# Patient Record
Sex: Female | Born: 1980 | Hispanic: No | Marital: Married | Smoking: Never smoker
Health system: Southern US, Community
[De-identification: ages and names within clinical notes are randomized; demographics above are authoritative.]

## PROBLEM LIST (undated history)

## (undated) ENCOUNTER — Inpatient Hospital Stay (HOSPITAL_COMMUNITY): Payer: Self-pay

## (undated) ENCOUNTER — Emergency Department (HOSPITAL_COMMUNITY): Admission: EM | Payer: BLUE CROSS/BLUE SHIELD

## (undated) DIAGNOSIS — N83209 Unspecified ovarian cyst, unspecified side: Secondary | ICD-10-CM

## (undated) DIAGNOSIS — O24419 Gestational diabetes mellitus in pregnancy, unspecified control: Secondary | ICD-10-CM

## (undated) DIAGNOSIS — Z789 Other specified health status: Secondary | ICD-10-CM

## (undated) HISTORY — PX: CHOLECYSTECTOMY: SHX55

---

## 2009-07-30 ENCOUNTER — Ambulatory Visit: Payer: Self-pay | Admitting: Gynecology

## 2009-07-30 ENCOUNTER — Inpatient Hospital Stay (HOSPITAL_COMMUNITY): Admission: AD | Admit: 2009-07-30 | Discharge: 2009-07-30 | Payer: Self-pay | Admitting: Obstetrics and Gynecology

## 2009-08-02 ENCOUNTER — Inpatient Hospital Stay (HOSPITAL_COMMUNITY): Admission: AD | Admit: 2009-08-02 | Discharge: 2009-08-02 | Payer: Self-pay | Admitting: Obstetrics and Gynecology

## 2009-08-02 ENCOUNTER — Ambulatory Visit: Payer: Self-pay | Admitting: Obstetrics and Gynecology

## 2009-08-10 ENCOUNTER — Ambulatory Visit: Payer: Self-pay | Admitting: Advanced Practice Midwife

## 2009-08-10 ENCOUNTER — Inpatient Hospital Stay (HOSPITAL_COMMUNITY): Admission: AD | Admit: 2009-08-10 | Discharge: 2009-08-10 | Payer: Self-pay | Admitting: Obstetrics & Gynecology

## 2009-09-16 ENCOUNTER — Ambulatory Visit: Payer: Self-pay | Admitting: Obstetrics & Gynecology

## 2009-10-28 ENCOUNTER — Other Ambulatory Visit: Admission: RE | Admit: 2009-10-28 | Discharge: 2009-10-28 | Payer: Self-pay | Admitting: Obstetrics & Gynecology

## 2009-10-28 ENCOUNTER — Ambulatory Visit: Payer: Self-pay | Admitting: Obstetrics & Gynecology

## 2009-10-28 ENCOUNTER — Encounter: Payer: Self-pay | Admitting: Obstetrics & Gynecology

## 2009-10-28 LAB — CONVERTED CEMR LAB: Chlamydia, DNA Probe: NEGATIVE

## 2010-01-04 ENCOUNTER — Ambulatory Visit (HOSPITAL_COMMUNITY)
Admission: RE | Admit: 2010-01-04 | Discharge: 2010-01-04 | Payer: Self-pay | Source: Home / Self Care | Attending: Obstetrics and Gynecology | Admitting: Obstetrics and Gynecology

## 2010-04-01 LAB — POCT PREGNANCY, URINE: Preg Test, Ur: NEGATIVE

## 2010-04-03 LAB — HCG, QUANTITATIVE, PREGNANCY
hCG, Beta Chain, Quant, S: 3 m[IU]/mL (ref <5)
hCG, Beta Chain, Quant, S: 47 m[IU]/mL — ABNORMAL HIGH (ref <5)

## 2010-04-04 LAB — DIFFERENTIAL
Basophils Absolute: 0.1 10*3/uL (ref 0.0–0.1)
Basophils Relative: 1 % (ref 0–1)
Monocytes Absolute: 0.5 10*3/uL (ref 0.1–1.0)
Neutrophils Relative %: 68 % (ref 43–77)

## 2010-04-04 LAB — CBC
HCT: 36.4 % (ref 36.0–46.0)
MCH: 27.7 pg (ref 26.0–34.0)
RBC: 4.41 MIL/uL (ref 3.87–5.11)
RDW: 13.1 % (ref 11.5–15.5)
WBC: 8.7 10*3/uL (ref 4.0–10.5)

## 2010-04-04 LAB — WET PREP, GENITAL
Clue Cells Wet Prep HPF POC: NONE SEEN
Trich, Wet Prep: NONE SEEN
Yeast Wet Prep HPF POC: NONE SEEN

## 2010-04-04 LAB — ABO/RH: ABO/RH(D): B POS

## 2010-04-04 LAB — POCT PREGNANCY, URINE: Preg Test, Ur: NEGATIVE

## 2010-04-04 LAB — COMPREHENSIVE METABOLIC PANEL
AST: 14 U/L (ref 0–37)
Albumin: 4.3 g/dL (ref 3.5–5.2)
Alkaline Phosphatase: 43 U/L (ref 39–117)
BUN: 5 mg/dL — ABNORMAL LOW (ref 6–23)
CO2: 25 mEq/L (ref 19–32)
Calcium: 9.3 mg/dL (ref 8.4–10.5)
GFR calc Af Amer: >60 mL/min (ref >60)
Glucose, Bld: 92 mg/dL (ref 70–99)
Potassium: 3.4 mEq/L — ABNORMAL LOW (ref 3.5–5.1)
Sodium: 135 mEq/L (ref 135–145)
Total Bilirubin: 0.4 mg/dL (ref 0.3–1.2)

## 2010-04-04 LAB — PREGNANCY, URINE: Preg Test, Ur: POSITIVE

## 2010-04-04 LAB — GC/CHLAMYDIA PROBE AMP, GENITAL
Chlamydia, DNA Probe: NEGATIVE
GC Probe Amp, Genital: NEGATIVE

## 2010-04-04 LAB — URINE MICROSCOPIC-ADD ON

## 2010-04-04 LAB — HCG, QUANTITATIVE, PREGNANCY: hCG, Beta Chain, Quant, S: 89 m[IU]/mL — ABNORMAL HIGH (ref <5)

## 2010-04-04 LAB — URINALYSIS, ROUTINE W REFLEX MICROSCOPIC
Glucose, UA: NEGATIVE mg/dL
Nitrite: NEGATIVE

## 2011-01-30 ENCOUNTER — Ambulatory Visit (HOSPITAL_COMMUNITY)
Admission: AD | Admit: 2011-01-30 | Discharge: 2011-01-31 | Disposition: A | Discharge status: Home / Self Care | Payer: Medicaid Other | Source: Ambulatory Visit | Attending: Obstetrics and Gynecology | Admitting: Obstetrics and Gynecology

## 2011-01-30 ENCOUNTER — Encounter (HOSPITAL_COMMUNITY): Payer: Self-pay | Admitting: *Deleted

## 2011-01-30 DIAGNOSIS — O009 Unspecified ectopic pregnancy without intrauterine pregnancy: Secondary | ICD-10-CM | POA: Diagnosis present

## 2011-01-30 DIAGNOSIS — O00109 Unspecified tubal pregnancy without intrauterine pregnancy: Principal | ICD-10-CM | POA: Insufficient documentation

## 2011-01-30 HISTORY — DX: Other specified health status: Z78.9

## 2011-01-30 HISTORY — DX: Gestational diabetes mellitus in pregnancy, unspecified control: O24.419

## 2011-01-30 NOTE — Progress Notes (Signed)
Pt having lower abd pain greater on right side.  Pt was seen at [facility] last pm, dx with left ovarian cyst.  Pt 6.[redacted]wks pregnant.  Pt G3 P1.

## 2011-01-31 ENCOUNTER — Encounter (HOSPITAL_COMMUNITY): Payer: Self-pay | Admitting: Anesthesiology

## 2011-01-31 ENCOUNTER — Encounter (HOSPITAL_COMMUNITY): Payer: Self-pay | Admitting: Physician Assistant

## 2011-01-31 ENCOUNTER — Encounter (HOSPITAL_COMMUNITY)
Admission: AD | Disposition: A | Discharge status: Home / Self Care | Payer: Self-pay | Source: Ambulatory Visit | Attending: Obstetrics and Gynecology

## 2011-01-31 ENCOUNTER — Inpatient Hospital Stay (HOSPITAL_COMMUNITY): Payer: Medicaid Other

## 2011-01-31 ENCOUNTER — Other Ambulatory Visit: Payer: Self-pay | Admitting: Obstetrics and Gynecology

## 2011-01-31 ENCOUNTER — Inpatient Hospital Stay (HOSPITAL_COMMUNITY): Payer: Medicaid Other | Admitting: Anesthesiology

## 2011-01-31 DIAGNOSIS — O009 Unspecified ectopic pregnancy without intrauterine pregnancy: Secondary | ICD-10-CM | POA: Diagnosis present

## 2011-01-31 DIAGNOSIS — O00109 Unspecified tubal pregnancy without intrauterine pregnancy: Secondary | ICD-10-CM

## 2011-01-31 HISTORY — PX: LAPAROSCOPY: SHX197

## 2011-01-31 LAB — CBC
MCH: 27.1 pg (ref 26.0–34.0)
MCHC: 33.3 g/dL (ref 30.0–36.0)
Platelets: 212 10*3/uL (ref 150–400)

## 2011-01-31 LAB — TYPE AND SCREEN: ABO/RH(D): B POS

## 2011-01-31 SURGERY — LAPAROSCOPY OPERATIVE
Anesthesia: General | Laterality: Right

## 2011-01-31 MED ORDER — ONDANSETRON HCL 4 MG/2ML IJ SOLN
INTRAMUSCULAR | Status: DC | PRN
  Administered 2011-01-31: 4 mg via INTRAVENOUS

## 2011-01-31 MED ORDER — PROPOFOL 10 MG/ML IV EMUL
INTRAVENOUS | Status: AC
  Filled 2011-01-31: qty 20

## 2011-01-31 MED ORDER — FAMOTIDINE IN NACL 20-0.9 MG/50ML-% IV SOLN
INTRAVENOUS | Status: AC
  Filled 2011-01-31: qty 50

## 2011-01-31 MED ORDER — KETOROLAC TROMETHAMINE 30 MG/ML IJ SOLN: 15.0000 mg | Freq: Once | INTRAMUSCULAR | Status: DC | PRN

## 2011-01-31 MED ORDER — FENTANYL CITRATE 0.05 MG/ML IJ SOLN
INTRAMUSCULAR | Status: AC
  Filled 2011-01-31: qty 2

## 2011-01-31 MED ORDER — ROCURONIUM BROMIDE 50 MG/5ML IV SOLN
INTRAVENOUS | Status: AC
  Filled 2011-01-31: qty 1

## 2011-01-31 MED ORDER — PHENYLEPHRINE 40 MCG/ML (10ML) SYRINGE FOR IV PUSH (FOR BLOOD PRESSURE SUPPORT)
PREFILLED_SYRINGE | INTRAVENOUS | Status: AC
  Filled 2011-01-31: qty 5

## 2011-01-31 MED ORDER — PHENYLEPHRINE HCL 10 MG/ML IJ SOLN
INTRAMUSCULAR | Status: DC | PRN
  Administered 2011-01-31: .8 mg via INTRAVENOUS

## 2011-01-31 MED ORDER — PROMETHAZINE HCL 25 MG/ML IJ SOLN
12.5000 mg | Freq: Once | INTRAMUSCULAR | Status: AC
  Administered 2011-01-31: 25 mg via INTRAMUSCULAR
  Filled 2011-01-31: qty 1

## 2011-01-31 MED ORDER — FENTANYL CITRATE 0.05 MG/ML IJ SOLN
INTRAMUSCULAR | Status: AC
  Filled 2011-01-31: qty 5

## 2011-01-31 MED ORDER — LIDOCAINE-EPINEPHRINE (PF) 1 %-1:200000 IJ SOLN
INTRAMUSCULAR | Status: DC | PRN
  Administered 2011-01-31: 5 mL

## 2011-01-31 MED ORDER — KETOROLAC TROMETHAMINE 30 MG/ML IJ SOLN
INTRAMUSCULAR | Status: DC | PRN
  Administered 2011-01-31: 30 mg via INTRAVENOUS

## 2011-01-31 MED ORDER — CITRIC ACID-SODIUM CITRATE 334-500 MG/5ML PO SOLN
30.0000 mL | Freq: Once | ORAL | Status: AC
  Administered 2011-01-31: 30 mL via ORAL

## 2011-01-31 MED ORDER — PROPOFOL 10 MG/ML IV EMUL
INTRAVENOUS | Status: DC | PRN
  Administered 2011-01-31: 200 mg via INTRAVENOUS

## 2011-01-31 MED ORDER — LIDOCAINE HCL (CARDIAC) 20 MG/ML IV SOLN
INTRAVENOUS | Status: AC
  Filled 2011-01-31: qty 5

## 2011-01-31 MED ORDER — FENTANYL CITRATE 0.05 MG/ML IJ SOLN
INTRAMUSCULAR | Status: DC | PRN
  Administered 2011-01-31: 50 ug via INTRAVENOUS
  Administered 2011-01-31: 100 ug via INTRAVENOUS
  Administered 2011-01-31: 150 ug via INTRAVENOUS
  Administered 2011-01-31: 100 ug via INTRAVENOUS
  Administered 2011-01-31: 150 ug via INTRAVENOUS

## 2011-01-31 MED ORDER — LACTATED RINGERS IR SOLN
Status: DC | PRN
  Administered 2011-01-31: 1

## 2011-01-31 MED ORDER — NEOSTIGMINE METHYLSULFATE 1 MG/ML IJ SOLN
INTRAMUSCULAR | Status: DC | PRN
  Administered 2011-01-31: 3 mg via INTRAVENOUS

## 2011-01-31 MED ORDER — MORPHINE SULFATE 4 MG/ML IJ SOLN
2.0000 mg | Freq: Once | INTRAMUSCULAR | Status: AC
  Administered 2011-01-31: 2 mg via INTRAMUSCULAR
  Filled 2011-01-31: qty 1

## 2011-01-31 MED ORDER — MIDAZOLAM HCL 2 MG/2ML IJ SOLN
INTRAMUSCULAR | Status: AC
  Filled 2011-01-31: qty 2

## 2011-01-31 MED ORDER — LIDOCAINE HCL (CARDIAC) 20 MG/ML IV SOLN
INTRAVENOUS | Status: DC | PRN
  Administered 2011-01-31: 40 mg via INTRAVENOUS

## 2011-01-31 MED ORDER — ACETAMINOPHEN 325 MG PO TABS: 325.0000 mg | ORAL_TABLET | ORAL | Status: DC | PRN

## 2011-01-31 MED ORDER — NEOSTIGMINE METHYLSULFATE 1 MG/ML IJ SOLN
INTRAMUSCULAR | Status: AC
  Filled 2011-01-31: qty 10

## 2011-01-31 MED ORDER — SUCCINYLCHOLINE CHLORIDE 20 MG/ML IJ SOLN
INTRAMUSCULAR | Status: AC
  Filled 2011-01-31: qty 10

## 2011-01-31 MED ORDER — CITRIC ACID-SODIUM CITRATE 334-500 MG/5ML PO SOLN
ORAL | Status: AC
  Filled 2011-01-31: qty 15

## 2011-01-31 MED ORDER — FENTANYL CITRATE 0.05 MG/ML IJ SOLN: 25.0000 ug | INTRAMUSCULAR | Status: DC | PRN

## 2011-01-31 MED ORDER — PROMETHAZINE HCL 25 MG/ML IJ SOLN: 6.2500 mg | INTRAMUSCULAR | Status: DC | PRN

## 2011-01-31 MED ORDER — FAMOTIDINE IN NACL 20-0.9 MG/50ML-% IV SOLN
20.0000 mg | Freq: Once | INTRAVENOUS | Status: AC
  Administered 2011-01-31: 20 mg via INTRAVENOUS

## 2011-01-31 MED ORDER — ROCURONIUM BROMIDE 100 MG/10ML IV SOLN
INTRAVENOUS | Status: DC | PRN
  Administered 2011-01-31: 2.5 mg via INTRAVENOUS
  Administered 2011-01-31: 30 mg via INTRAVENOUS
  Administered 2011-01-31: 10 mg via INTRAVENOUS

## 2011-01-31 MED ORDER — GLYCOPYRROLATE 0.2 MG/ML IJ SOLN
INTRAMUSCULAR | Status: AC
  Filled 2011-01-31: qty 1

## 2011-01-31 MED ORDER — LACTATED RINGERS IV SOLN
INTRAVENOUS | Status: DC | PRN
  Administered 2011-01-31 (×2): via INTRAVENOUS

## 2011-01-31 MED ORDER — GLYCOPYRROLATE 0.2 MG/ML IJ SOLN
INTRAMUSCULAR | Status: DC | PRN
  Administered 2011-01-31: .6 mg via INTRAVENOUS

## 2011-01-31 MED ORDER — LACTATED RINGERS IV SOLN
INTRAVENOUS | Status: DC
  Administered 2011-01-31: 02:00:00 via INTRAVENOUS

## 2011-01-31 MED ORDER — ONDANSETRON HCL 4 MG/2ML IJ SOLN
INTRAMUSCULAR | Status: AC
  Filled 2011-01-31: qty 2

## 2011-01-31 MED ORDER — SUCCINYLCHOLINE CHLORIDE 20 MG/ML IJ SOLN
INTRAMUSCULAR | Status: DC | PRN
  Administered 2011-01-31: 120 mg via INTRAVENOUS

## 2011-01-31 MED ORDER — MIDAZOLAM HCL 5 MG/5ML IJ SOLN
INTRAMUSCULAR | Status: DC | PRN
  Administered 2011-01-31: 2 mg via INTRAVENOUS

## 2011-01-31 SURGICAL SUPPLY — 30 items
BARRIER ADHS 3X4 INTERCEED (GAUZE/BANDAGES/DRESSINGS) IMPLANT
BENZOIN TINCTURE PRP APPL 2/3 (GAUZE/BANDAGES/DRESSINGS) IMPLANT
CATH ROBINSON RED A/P 16FR (CATHETERS) IMPLANT
CLOTH BEACON ORANGE TIMEOUT ST (SAFETY) ×2 IMPLANT
DERMABOND ADVANCED (GAUZE/BANDAGES/DRESSINGS)
DERMABOND ADVANCED .7 DNX12 (GAUZE/BANDAGES/DRESSINGS) IMPLANT
DRSG COVADERM PLUS 2X2 (GAUZE/BANDAGES/DRESSINGS) ×2 IMPLANT
EVACUATOR SMOKE 8.L (FILTER) IMPLANT
GLOVE BIO SURGEON ST LM GN SZ9 (GLOVE) ×2 IMPLANT
GLOVE BIOGEL PI IND STRL 9 (GLOVE) ×2 IMPLANT
GLOVE BIOGEL PI INDICATOR 9 (GLOVE) ×2
GOWN PREVENTION PLUS LG XLONG (DISPOSABLE) ×2 IMPLANT
GOWN PREVENTION PLUS XLARGE (GOWN DISPOSABLE) ×2 IMPLANT
GOWN STRL REIN 3XL LVL4 (GOWN DISPOSABLE) ×2 IMPLANT
NEEDLE INSUFFLATION 14GA 120MM (NEEDLE) ×2 IMPLANT
NS IRRIG 1000ML POUR BTL (IV SOLUTION) ×2 IMPLANT
PACK LAPAROSCOPY BASIN (CUSTOM PROCEDURE TRAY) ×2 IMPLANT
POUCH SPECIMEN RETRIEVAL 10MM (ENDOMECHANICALS) IMPLANT
SCALPEL HARMONIC ACE (MISCELLANEOUS) IMPLANT
SCISSORS LAP 5X35 DISP (ENDOMECHANICALS) IMPLANT
SET IRRIG TUBING LAPAROSCOPIC (IRRIGATION / IRRIGATOR) ×2 IMPLANT
STRIP CLOSURE SKIN 1/2X4 (GAUZE/BANDAGES/DRESSINGS) ×2 IMPLANT
STRIP CLOSURE SKIN 1/4X3 (GAUZE/BANDAGES/DRESSINGS) ×2 IMPLANT
SUT VIC AB 4-0 PS2 27 (SUTURE) ×2 IMPLANT
SUT VICRYL 0 UR6 27IN ABS (SUTURE) ×2 IMPLANT
TOWEL OR 17X24 6PK STRL BLUE (TOWEL DISPOSABLE) ×4 IMPLANT
TRAY FOLEY CATH 14FR (SET/KITS/TRAYS/PACK) ×2 IMPLANT
TROCAR Z-THREAD BLADED 11X100M (TROCAR) ×4 IMPLANT
TROCAR Z-THREAD BLADED 5X100MM (TROCAR) ×4 IMPLANT
WATER STERILE IRR 1000ML POUR (IV SOLUTION) ×2 IMPLANT

## 2011-01-31 NOTE — Addendum Note (Signed)
Addendum  created 01/31/11 0513 by Velna Hatchet, MD   Modules edited:Charting, Inpatient Notes, Notes Section, Orders

## 2011-01-31 NOTE — Transfer of Care (Signed)
Immediate Anesthesia Transfer of Care Note  Patient: Krista Rowland  Procedure(s) Performed:  LAPAROSCOPY OPERATIVE - laparoscopic right lateral salpingostomy  Patient Location: PACU  Anesthesia Type: General  Level of Consciousness: oriented and sedated  Airway & Oxygen Therapy: Patient Spontanous Breathing and Patient connected to nasal cannula oxygen  Post-op Assessment: Report given to PACU RN and Post -op Vital signs reviewed and stable  Post vital signs: stable Filed Vitals:   01/31/11 0232  BP: 116/61  Pulse: 88  Temp:   Resp: 20    Complications: No apparent anesthesia complications

## 2011-01-31 NOTE — Anesthesia Procedure Notes (Signed)
Procedure Name: Intubation Performed by: Que Meneely D Pre-anesthesia Checklist: Patient identified, Patient being monitored, Emergency Drugs available, Timeout performed and Suction available Patient Re-evaluated:Patient Re-evaluated prior to inductionOxygen Delivery Method: Circle System Utilized Preoxygenation: Pre-oxygenation with 100% oxygen Intubation Type: IV induction, Rapid sequence and Cricoid Pressure applied Laryngoscope Size: Mac and 3 Grade View: Grade I Tube type: Oral Tube size: 7.0 mm Number of attempts: 1 Airway Equipment and Method: stylet Placement Confirmation: ETT inserted through vocal cords under direct vision,  positive ETCO2 and breath sounds checked- equal and bilateral Secured at: 21 cm Dental Injury: Teeth and Oropharynx as per pre-operative assessment

## 2011-01-31 NOTE — Anesthesia Postprocedure Evaluation (Addendum)
Anesthesia Post Note  Patient: Krista Rowland  Procedure(s) Performed:  LAPAROSCOPY OPERATIVE - laparoscopic right lateral salpingostomy  Anesthesia type: GA  Patient location: PACU  Post pain: Pain level controlled  Post assessment: Post-op Vital signs reviewed  Last Vitals:  Filed Vitals:   01/31/11 0232  BP: 116/61  Pulse: 88  Temp:   Resp: 20    Post vital signs: Reviewed  Level of consciousness: sedated  Complications: No apparent anesthesia complications. Low O2 sats.  CXR ordered.  Further review showed wall oxygen not connected.  CXR shows typical post operative atelectasis.

## 2011-01-31 NOTE — Anesthesia Preprocedure Evaluation (Addendum)
Anesthesia Evaluation  Patient identified by MRN, date of birth, ID band Patient awake    Reviewed: Allergy & Precautions, H&P , Patient's Chart, lab work & pertinent test results, reviewed documented beta blocker date and time   History of Anesthesia Complications Negative for: history of anesthetic complications  Airway Mallampati: II TM Distance: >3 FB Neck ROM: full    Dental No notable dental hx.    Pulmonary neg pulmonary ROS,  clear to auscultation  Pulmonary exam normal       Cardiovascular Exercise Tolerance: Good neg cardio ROS regular Normal    Neuro/Psych Negative Neurological ROS  Negative Psych ROS   GI/Hepatic negative GI ROS, Neg liver ROS,   Endo/Other  Negative Endocrine ROS  Renal/GU negative Renal ROS     Musculoskeletal   Abdominal   Peds  Hematology negative hematology ROS (+)   Anesthesia Other Findings Hemoperitoneum from ruptured ectopic Npo 9pm chicken  Reproductive/Obstetrics negative OB ROS                           Anesthesia Physical Anesthesia Plan  ASA: III and Emergent  Anesthesia Plan: General ETT   Post-op Pain Management:    Induction:   Airway Management Planned:   Additional Equipment:   Intra-op Plan:   Post-operative Plan:   Informed Consent: I have reviewed the patients History and Physical, chart, labs and discussed the procedure including the risks, benefits and alternatives for the proposed anesthesia with the patient or authorized representative who has indicated his/her understanding and acceptance.   Dental Advisory Given  Plan Discussed with: CRNA and Surgeon  Anesthesia Plan Comments:        RSI with cricoid Anesthesia Quick Evaluation

## 2011-01-31 NOTE — Addendum Note (Signed)
Addendum  created 01/31/11 0520 by Velna Hatchet, MD   Modules edited:Charting, Inpatient Notes, Notes Section, Orders

## 2011-01-31 NOTE — Brief Op Note (Signed)
01/30/2011 - 01/31/2011  4:18 AM  PATIENT:  Krista Rowland  31 y.o. female  PRE-OPERATIVE DIAGNOSIS:  ruptured ectopic pregnancy   POST-OPERATIVE DIAGNOSIS:  ruptured ectopic pregnancy   PROCEDURE:  Procedure(s): LAPAROSCOPY OPERATIVE, right linear salpingostomy  SURGEON:  Surgeon(s): Tilda Burrow, MD Karyl Kinnier Stinson, DO  PHYSICIAN ASSISTANT:   ASSISTANTS: none   ANESTHESIA:   general  EBL:  Total I/O In: 2400 [I.V.:2400] Out: 750 [Urine:300; Other:400; Blood:50]  BLOOD ADMINISTERED:none  DRAINS: Urinary Catheter (Foley)   LOCAL MEDICATIONS USED:  MARCAINE 10CC  SPECIMEN:  Source of Specimen:  right tubal contents  DISPOSITION OF SPECIMEN:  PATHOLOGY  COUNTS:  YES  TOURNIQUET:  * No tourniquets in log *  DICTATION: .Dragon Dictation Patient was taken from the MAU to the operating room after consents obtained. General anesthesia introduced and procedure confirmed during timeout surgical team Foley catheter was inserted and Hulka tenaculum attached to the cervix for uterine manipulation. An infraumbilical vertical 1 cm skin incision was made as well as a transverse suprapubic incision and a right lower quadrant incision. 11 mm trocar was inserted through the umbilicus after Veress needle had been used to achieve pneumoperitoneum without difficulty. Suprapubic trocar was inserted under direct visualization, and the lower quadrant trocar as well. Pelvis was inspected and documented has been filled with blood clots. With the patient in Trendelenburg position the blood clots could be suctioned out of the pelvis. The right tube was unruptured of bleeding from the tip in the past. Mesosalpinx was infiltrated percutaneously with 5-7 cc of lidocaine with epinephrine. 2 cc of lidocaine was placed in the ectopic. A linear antimesenteric linear salpingostomy was performed and adequate dissection used to expel the ectopic tissue which was grasped and extracted intact, placed in a  laparoscopic Endo Catch bag and extracted without difficulty pelvis was further irrigated and fluid removed from the upper abdomen and irrigation performed. Pelvis was reinspected and fluid noted to be significantly clear proximally 100 cc of clear saline was placed in the abdomen to assess deflation of the abdomen and laparoscopic instruments removed. Fascia was closed at the umbilicus and suprapubic site with 0 Vicryl in all 3 trocar sites closed subcutaneously with 4-0 Vicryl sponge and needle counts correct Hulka tenaculum removed Foley catheter to be removed in recovery room PLAN OF CARE: Discharge to home after PACU  PATIENT DISPOSITION:  PACU - hemodynamically stable.   Delay start of Pharmacological VTE agent (>24hrs) due to surgical blood loss or risk of bleeding:  {YES/NO/NOT APPLICABLE:20182

## 2011-01-31 NOTE — ED Provider Notes (Signed)
Chief Complaint:  Abdominal Pain   Krista Rowland is  31 y.o. G3P1011.  Patient's last menstrual period was 12/15/2010.Marland Kitchen  Her pregnancy status is positive.  She presents complaining of Abdominal Pain . Pt states she was seen at [facility] ED yesterday for abd pain in pregnancy. Reports [place], labs, and screening for infection was performed and she was diagnosed with early pregnancy and left ovarian cyst. Was d/c home with Rx for Vicodin. States last Vicodin at 10pm with not relief of pain.States brown spotting yesterday and today with wiping after void. Mid-abd cramping, worse on right.  Obstetrical/Gynecological History: OB History    Grav Para Term Preterm Abortions TAB SAB Ect Mult Living   3 1 1  1  1   1       Past Medical History: Past Medical History  Diagnosis Date  . No pertinent past medical history   . Gestational diabetes     Past Surgical History: Past Surgical History  Procedure Date  . Cesarean section     Family History: Family History  Problem Relation Age of Onset  . Anesthesia problems Neg Hx   . Hypotension Neg Hx   . Malignant hyperthermia Neg Hx   . Pseudochol deficiency Neg Hx     Social History: History  Substance Use Topics  . Smoking status: Never Smoker   . Smokeless tobacco: Never Used  . Alcohol Use: No    Allergies: No Known Allergies  Prescriptions prior to admission  Medication Sig Dispense Refill  . HYDROcodone-acetaminophen (VICODIN) 5-500 MG per tablet Take 1-2 tablets by mouth every 6 (six) hours as needed. For severe pain      . Prenatal Vit-Fe Fumarate-FA (PRENATAL MULTIVITAMIN) TABS Take 1 tablet by mouth daily.        Review of Systems - Negative except what has been reviewed in the HPI  Physical Exam   Blood pressure 121/69, pulse 95, temperature 97.9 F (36.6 C), temperature source Oral, resp. rate 18, height 5\' 2"  (1.575 m), weight 194 lb 12.8 oz (88.361 kg), last menstrual period 12/15/2010.  General: General  appearance - alert, well appearing, and in no distress, oriented to person, place, and time and overweight Mental status - alert, oriented to person, place, and time, normal mood, behavior, speech, dress, motor activity, and thought processes, affect appropriate to mood Abdomen - soft, nontender, nondistended, no masses or organomegaly Neurological - alert, oriented, normal speech, no focal findings or movement disorder noted, screening mental status exam normal Focused Gynecological Exam: VULVA: normal appearing vulva with no masses, tenderness or lesions, VAGINA: vaginal discharge - brown, CERVIX: normal appearing cervix without discharge or lesions, closed, UTERUS: tenderness moderate, unable to appreciate size d/t pt habitus, ADNEXA: tenderness bilateral  Labs: 01/29/11 from [facility]  B positive Quant: 2338 Hgb/Hct: 12.6/37.1 Plt: 225  [place]: No evidence of definite IUP. Nonspecifice 1cm fluid collection w/i the thickened endometrium. R ovary with 2cm cyst, L ovary: 3cm and 2 cm cyst. No Free fluid  ED Course: More comfortable after IM morphine/phenergan  MD Consult: Discussed with Dr. Emelda Fear, will repeat US and quant now.  Radiology: RADIOLOGY REPORT*  Clinical Data: Abdominal pain, positive pregnancy test  OBSTETRIC <14 WK [place] AND TRANSVAGINAL OB US  Technique: Both transabdominal and transvaginal ultrasound  examinations were performed for complete evaluation of the  gestation as well as the maternal uterus, adnexal regions, and  pelvic cul-de-sac. Transvaginal technique was performed to assess  early pregnancy.  Comparison:  None available  Intrauterine gestational sac: Not visualized  Yolk sac: Not visualized  Embryo: Not visualized  Cardiac Activity: Not detected  Maternal uterus/adnexae:  Heterogeneous irregular echogenic mass-like abnormality in the  right adnexa lateral to the right ovary, roughly measuring 4.1 x  3.5 cm. Hypoechoic complex free fluid  noted in the cul-de-sac  posterior to the uterus suspicious for blood products. No IUP  demonstrated. Findings suspicious for a ruptured hemorrhagic  ectopic pregnancy.  3.4 cm left ovarian cyst noted.  IMPRESSION:  4 cm heterogeneous right adnexal mass like abnormality lateral to  the right ovary with complex dependent free fluid. No visualized  IUP. Again, findings compatible with ruptured hemorrhagic ectopic  pregnancy.  Critical Value/emergent results were called by telephone at the  time of interpretation on 01/31/2011 at 1:45 a.m. to  Dr.Gavriella Rowland, who verbally acknowledged these results.  Original Report Authenticated By: Judie Petit. Ruel Favors, M.D.  Results for orders placed during the hospital encounter of 01/30/11 (from the past 24 hour(s))  HCG, QUANTITATIVE, PREGNANCY     Status: Abnormal   Collection Time   01/31/11 12:58 AM      Component Value Range   hCG, Beta Chain, Quant, S 1708 (*) <5 (mIU/mL)  CBC     Status: Abnormal   Collection Time   01/31/11  1:41 AM      Component Value Range   WBC 13.4 (*) 4.0 - 10.5 (K/uL)   RBC 3.77 (*) 3.87 - 5.11 (MIL/uL)   Hemoglobin 10.2 (*) 12.0 - 15.0 (g/dL)   HCT 16.1 (*) 09.6 - 46.0 (%)   MCV 81.2  78.0 - 100.0 (fL)   MCH 27.1  26.0 - 34.0 (pg)   MCHC 33.3  30.0 - 36.0 (g/dL)   RDW 04.5  40.9 - 81.1 (%)   Platelets 212  150 - 400 (K/uL)    Assessment: Ruptured Ectopic  Plan: Care assumed by Dr. Emelda Fear for surgical management. Consented at bedside by Dr. Emelda Fear.  Rowland,Krista E. 01/31/2011,2:05 AM   I have reviewed the patients record, examined the patient and witnessed the ultrasound.  I agree with the above findings and assessment and consider the case to be emergent, so will proceed promptly to the OR. Procedure explained to the patient and her husband with their consents obtained and documented.  Case discussed with the operating room staff and anesthesia.

## 2011-01-31 NOTE — Op Note (Signed)
see dictation notes in brief of note

## 2011-01-31 NOTE — Addendum Note (Signed)
Addendum  created 01/31/11 6295 by Velna Hatchet, MD   Modules edited:Notes Section

## 2011-01-31 NOTE — H&P (Signed)
Chief Complaint:  Abdominal Pain   Krista Rowland is  31 y.o. G3P1011.  Patient's last menstrual period was 12/15/2010.Marland Kitchen  Her pregnancy status is positive.  She presents complaining of Abdominal Pain . Pt states she was seen at [facility] ED yesterday for abd pain in pregnancy. Reports [place], labs, and screening for infection was performed and she was diagnosed with early pregnancy and left ovarian cyst. Was d/c home with Rx for Vicodin. States last Vicodin at 10pm with not relief of pain.States brown spotting yesterday and today with wiping after void. Mid-abd cramping, worse on right.  Obstetrical/Gynecological History: OB History    Grav Para Term Preterm Abortions TAB SAB Ect Mult Living   3 1 1  1  1   1       Past Medical History: Past Medical History  Diagnosis Date  . No pertinent past medical history   . Gestational diabetes     Past Surgical History: Past Surgical History  Procedure Date  . Cesarean section     Family History: Family History  Problem Relation Age of Onset  . Anesthesia problems Neg Hx   . Hypotension Neg Hx   . Malignant hyperthermia Neg Hx   . Pseudochol deficiency Neg Hx     Social History: History  Substance Use Topics  . Smoking status: Never Smoker   . Smokeless tobacco: Never Used  . Alcohol Use: No    Allergies: No Known Allergies  Prescriptions prior to admission  Medication Sig Dispense Refill  . HYDROcodone-acetaminophen (VICODIN) 5-500 MG per tablet Take 1-2 tablets by mouth every 6 (six) hours as needed. For severe pain      . Prenatal Vit-Fe Fumarate-FA (PRENATAL MULTIVITAMIN) TABS Take 1 tablet by mouth daily.        Review of Systems - Negative except what has been reviewed in the HPI  Physical Exam   Blood pressure 121/69, pulse 95, temperature 97.9 F (36.6 C), temperature source Oral, resp. rate 18, height 5\' 2"  (1.575 m), weight 88.361 kg (194 lb 12.8 oz), last menstrual period 12/15/2010.  General: General  appearance - alert, well appearing, and in no distress, oriented to person, place, and time and overweight Mental status - alert, oriented to person, place, and time, normal mood, behavior, speech, dress, motor activity, and thought processes, affect appropriate to mood Abdomen - soft, nontender, nondistended, no masses or organomegaly Neurological - alert, oriented, normal speech, no focal findings or movement disorder noted, screening mental status exam normal Focused Gynecological Exam: VULVA: normal appearing vulva with no masses, tenderness or lesions, VAGINA: vaginal discharge - brown, CERVIX: normal appearing cervix without discharge or lesions, closed, UTERUS: tenderness moderate, unable to appreciate size d/t pt habitus, ADNEXA: tenderness bilateral  Labs: 01/29/11 from [facility] records  B positive Quant: 2338 Hgb/Hct: 12.6/37.1 Plt: 225  [place]: No evidence of definite IUP. Nonspecifice 1cm fluid collection w/i the thickened endometrium. R ovary with 2cm cyst, L ovary: 3cm and 2 cm cyst. No Free fluid  ED Course: More comfortable after IM morphine/phenergan  MD Consult: Discussed with Dr. Emelda Fear, will repeat US and quant now.  Radiology: RADIOLOGY REPORT*  Clinical Data: Abdominal pain, positive pregnancy test  OBSTETRIC <14 WK [place] AND TRANSVAGINAL OB US  Technique: Both transabdominal and transvaginal ultrasound  examinations were performed for complete evaluation of the  gestation as well as the maternal uterus, adnexal regions, and  pelvic cul-de-sac. Transvaginal technique was performed to assess  early pregnancy.  Comparison:  None available  Intrauterine gestational sac: Not visualized  Yolk sac: Not visualized  Embryo: Not visualized  Cardiac Activity: Not detected  Maternal uterus/adnexae:  Heterogeneous irregular echogenic mass-like abnormality in the  right adnexa lateral to the right ovary, roughly measuring 4.1 x  3.5 cm. Hypoechoic complex free fluid  noted in the cul-de-sac  posterior to the uterus suspicious for blood products. No IUP  demonstrated. Findings suspicious for a ruptured hemorrhagic  ectopic pregnancy.  3.4 cm left ovarian cyst noted.  IMPRESSION:  4 cm heterogeneous right adnexal mass like abnormality lateral to  the right ovary with complex dependent free fluid. No visualized  IUP. Again, findings compatible with ruptured hemorrhagic ectopic  pregnancy.  Critical Value/emergent results were called by telephone at the  time of interpretation on 01/31/2011 at 1:45 a.m. to  Dr.Leonetta Mcgivern, who verbally acknowledged these results.  Original Report Authenticated By: Judie Petit. Ruel Favors, M.D.  Results for orders placed during the hospital encounter of 01/30/11 (from the past 24 hour(s))  HCG, QUANTITATIVE, PREGNANCY     Status: Abnormal   Collection Time   01/31/11 12:58 AM      Component Value Range   hCG, Beta Chain, Quant, S 1708 (*) <5 (mIU/mL)  CBC     Status: Abnormal   Collection Time   01/31/11  1:41 AM      Component Value Range   WBC 13.4 (*) 4.0 - 10.5 (K/uL)   RBC 3.77 (*) 3.87 - 5.11 (MIL/uL)   Hemoglobin 10.2 (*) 12.0 - 15.0 (g/dL)   HCT 16.1 (*) 09.6 - 46.0 (%)   MCV 81.2  78.0 - 100.0 (fL)   MCH 27.1  26.0 - 34.0 (pg)   MCHC 33.3  30.0 - 36.0 (g/dL)   RDW 04.5  40.9 - 81.1 (%)   Platelets 212  150 - 400 (K/uL)    Assessment: Ruptured Ectopic  Plan: Care assumed by Dr. Emelda Fear for surgical management. Consented at bedside by Dr. Emelda Fear.  Krista Rowland V 01/31/2011,2:21 AM   I have reviewed the patients record, examined the patient and witnessed the ultrasound.  I agree with the above findings and assessment and consider the case to be emergent, so will proceed promptly to the OR. Procedure explained to the patient and her husband with their consents obtained and documented.  Case discussed with the operating room staff and anesthesia.

## 2011-01-31 NOTE — Addendum Note (Signed)
Addendum  created 01/31/11 0450 by Velna Hatchet, MD   Modules edited:Charting, Inpatient Notes

## 2011-02-02 ENCOUNTER — Encounter (HOSPITAL_COMMUNITY): Payer: Self-pay | Admitting: Obstetrics and Gynecology

## 2011-02-04 ENCOUNTER — Encounter: Payer: Self-pay | Admitting: Obstetrics & Gynecology

## 2011-02-04 ENCOUNTER — Ambulatory Visit (INDEPENDENT_AMBULATORY_CARE_PROVIDER_SITE_OTHER): Payer: Self-pay | Admitting: Obstetrics & Gynecology

## 2011-02-04 VITALS — BP 115/78 | HR 94 | Temp 97.0°F | Ht 62.0 in | Wt 189.4 lb

## 2011-02-04 DIAGNOSIS — Z09 Encounter for follow-up examination after completed treatment for conditions other than malignant neoplasm: Secondary | ICD-10-CM

## 2011-02-04 DIAGNOSIS — O00109 Unspecified tubal pregnancy without intrauterine pregnancy: Secondary | ICD-10-CM

## 2011-02-04 MED ORDER — OXYCODONE-ACETAMINOPHEN 5-325 MG PO TABS: 1.0000 | ORAL_TABLET | Freq: Four times a day (QID) | ORAL | Status: AC | PRN

## 2011-02-04 MED ORDER — IBUPROFEN 600 MG PO TABS: 600.0000 mg | ORAL_TABLET | Freq: Four times a day (QID) | ORAL | Status: AC | PRN

## 2011-02-04 NOTE — Progress Notes (Signed)
  Subjective:     Krista Rowland is a 31 y.o. female who presents to the clinic after undergoing a laparoscopic right salpingoostomy for ruptured right tubal ectopic pregnancy on 01/30/11.  She reports having some pain around her incisions, but no othe concerns.  She is asking for more analgesia.   Eating a regular diet without difficulty. Bowel movements are normal. Bleeding is within normal expected amount.  Patient is traveling out of the country on 02/08/11 and will get any further recommended care there.  The following portions of the patient's history were reviewed and updated as appropriate: allergies, current medications, past family history, past medical history, past social history, past surgical history and problem list.  Review of Systems A comprehensive review of systems was negative.    Objective:    BP 115/78  Pulse 94  Temp(Src) 97 F (36.1 C) (Oral)  Ht 5\' 2"  (1.575 m)  Wt 189 lb 6.4 oz (85.911 kg)  BMI 34.64 kg/m2  LMP 02/03/2011  Breastfeeding? 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  Assessment:    Doing well postoperatively. Operative findings again reviewed. Pathology report discussed.  Also discussed increased risk of recurrent ectopic on the right side; future pregnancy precautions reviewed.   Plan:   1.Continue any current medications; Percocet and Ibuprofen prescribed 2. Wound care and activity discussed. 3. Quantitative HCG to be drawn today; patient needs weekly HCG until undetectable, will be done with her provider 4. Patient to fill pout release of information form, she will need her records prior to traveling 5. Follow up for any GYN concerns.

## 2011-02-04 NOTE — Patient Instructions (Signed)
Ectopic Pregnancy Ectopic pregnancy means the fertilized egg attached (implanted) outside the uterus. This occurs in about 1 out of 50 pregnancies. Many ectopic pregnancies occur in the fallopian tube (tube between the uterus and ovary). Ectopic pregnancies rarely occur on the ovary, intestine, pelvis, or cervix. It is the biggest cause of women dying in the first 12 weeks of their pregnancy. Death is a result of fallopian tube tearing (rupture) with severe internal bleeding. A tubal pregnancy does not have the room or blood supply to develop normally. As it grows, it stretches the walls of the tube. This causes pain. If an ectopic pregnancy ruptures through the tube, serious internal bleeding can occur, along with severe pain, fainting, and shock. This is a surgical emergency. CAUSES   Previous ectopic pregnancy.   Pelvic infection (PID, pelvic inflammatory disease).   Previous pelvic or abdominal surgery.   Uterus lining tissue growing outside the uterus (endometriosis).   Using an intrauterine device (IUD) for birth control (rare).   DES (estrogen drug) exposure when your mother was pregnant with you.   Pregnancy in women over 28 years old.   Smoking.   Infertility treatment, stimulating the ovaries to produce eggs for in vitro fertilization.   Previous fallopian tube surgery, such as reversing a tubal ligation (female sterilization, "tied tubes").   Blocked tubes.  Many times, there is no apparent cause. SYMPTOMS   The usual pregnancy symptoms:   Nausea.   Tiredness.   Breast tenderness.   Pain with intercourse.   Irregular vaginal bleeding or spotting.   Cramping or pain on one side, or in the lower abdomen.   Fast heartbeat.   Passing out while having a bowel movement.   If the tube ruptures and you develop internal bleeding, there will likely be:   Sudden, severe pain in the abdomen and pelvis.   Dizziness or fainting.   Pain in the shoulder area  (sometimes).  DIAGNOSIS   Diagnosis is made first by confirming the pregnancy with a pregnancy test.   Ultrasound.   Testing levels of pregnancy hormones and changing of the hormone levels in the blood.   Taking a sample of uterus tissue (D&C, dilation and curettage).   Visual exam inside the abdomen, using a lighted tube (laparoscopy).   Rarely, CT scan and MRI are needed.  TREATMENT   An injection of methotrexate medicine. This is given if:   The diagnosis is made early, and the pregnancy in the tube is 3.5 centimeters or less in size.   The tube has not ruptured.   Usually, pregnancy hormone blood levels are followed after this treatment, to be sure there is no pregnancy tissue left.   It takes 4 to 6 weeks for the pregnancy to be absorbed.   Surgical treatment, using a lighted tube (laparoscope), to remove the tubal pregnancy.   If severe internal bleeding occurs, a cut (incision) in the lower abdomen (laparotomy) is made, to remove the tubal pregnancy and to stop bleeding. This is a surgical emergency.   The area of the tube with the pregnancy may be removed. Sometimes, the whole tube must be removed.   After surgery, pregnancy hormone tests may be done to be sure there is no pregnancy tissue left.   If the mother has Rh-negative blood and the father is Rh-positive, you will need a RhoGAM shot to prevent Rh problems with future pregnancies.   You may need a blood transfusion (donated blood).   You may be put  on an antibiotic medicine.  HOME CARE INSTRUCTIONS   Get plenty of rest.   Avoid sexual intercourse until your caregiver says it is OK.   Avoid lifting more than 5 pounds.   Eat a well balanced diet.   Do not drink alcoholic drinks, especially while taking pain medicine.   Take all the medicines your caregiver gives you, including iron pills if you lost a lot of blood.   Do not take aspirin, because it can cause bleeding.   If you had abdominal surgery,  change your dressing as instructed.   Do not lift, over-exercise, drive, or do household chores until your caregiver gives you permission.   If you develop constipation, you may take a mild laxative recommended by your caregiver. Bran foods and drinking a lot of liquids helps with constipation.   Try to have someone help you with household chores for a week or two.   Make and keep all your follow up appointments and blood tests.   If you had methotrexate treatment:   Avoid all forms of alcohol.   Avoid vitamins with folic acid in them.   Do not take nonsteroidal anti-inflammatory drugs (NSAIDs), such as ibuprofen.   You and your partner may develop emotional problems. If so:   Grieving helps.   Counseling may be necessary.   Let your body and mind heal before trying to get pregnant again.  SEEK IMMEDIATE MEDICAL CARE IF:   You develop severe pain in the pelvis, abdomen, back, side, or shoulder, especially if you are pregnant.   You develop extreme weakness, fainting, repeated vomiting, or dehydration.   You develop heavy vaginal bleeding or passage of tissue.   You have an oral temperature above 102 F (38.9 C), not controlled by medicine.   You develop a rash or have a reaction to your medicine.   You pass out while having a bowel movement. This indicates a rupture of the tube, with internal bleeding.   You had abdominal surgery and:   There is bleeding from the incision.   There is redness, swelling, and pain around the incision.   There is pus coming out of the incision.   The incision is opening up.   You have shortness of breath.   You have chest or leg pain.   You become dizzy or pass out.   You have side effects from the methotrexate injection, including:   Nausea.   Vomiting.   Diarrhea.   Dizziness.  Document Released: 02/11/2004 Document Revised: 07/19/2010 Document Reviewed: 01/13/2009 [facility] Patient Information 2012 [place], [place].

## 2011-02-05 LAB — HCG, QUANTITATIVE, PREGNANCY: hCG, Beta Chain, Quant, S: 121.7 m[IU]/mL

## 2011-05-22 ENCOUNTER — Inpatient Hospital Stay (HOSPITAL_COMMUNITY)
Admission: AD | Admit: 2011-05-22 | Discharge: 2011-05-22 | Disposition: A | Discharge status: Hospice | Payer: Medicaid Other | Source: Ambulatory Visit | Attending: Obstetrics & Gynecology | Admitting: Obstetrics & Gynecology

## 2011-05-22 DIAGNOSIS — J189 Pneumonia, unspecified organism: Secondary | ICD-10-CM

## 2011-05-22 DIAGNOSIS — O99891 Other specified diseases and conditions complicating pregnancy: Secondary | ICD-10-CM | POA: Insufficient documentation

## 2011-05-22 DIAGNOSIS — R05 Cough: Secondary | ICD-10-CM | POA: Insufficient documentation

## 2011-05-22 DIAGNOSIS — R059 Cough, unspecified: Secondary | ICD-10-CM | POA: Insufficient documentation

## 2011-05-22 MED ORDER — AZITHROMYCIN 250 MG PO TABS: 250.0000 mg | ORAL_TABLET | Freq: Every day | ORAL | Status: AC

## 2011-05-22 NOTE — MAU Note (Signed)
Pt dx with "lung infection 2 weeks ago in [place]. Given medication for infection. Still having chest pain in left chest pain comes and goes and hurts with deep inspiration. Still has a cough with mucus

## 2011-05-22 NOTE — MAU Provider Note (Signed)
  History     CSN: [id number]  Arrival date and time: 05/22/11 1440   None     Chief Complaint  Patient presents with  . Pleurisy   HPI 31 yo with 1 month history of productive cough with pleurisy.  Appears to be worsening.  Was treated about 1 month ago in [place] for lung infection.  Not sure of the medicine.  Has intermittent fevers, left upper chest discomfort with coughing and breathing.  Coughin keeps her up at night.  Nothing is improving her cough.   OB History    Grav Para Term Preterm Abortions TAB SAB Ect Mult Living   3 1 1  1   1  1       Past Medical History  Diagnosis Date  . No pertinent past medical history   . Gestational diabetes     Past Surgical History  Procedure Date  . Cesarean section   . Laparoscopy 01/31/2011    Procedure: LAPAROSCOPY OPERATIVE;  Surgeon: Tilda Burrow, MD;  Location: [facility];  Service: Gynecology;  Laterality: Right;  laparoscopic right lateral salpingostomy    Family History  Problem Relation Age of Onset  . Anesthesia problems Neg Hx   . Hypotension Neg Hx   . Malignant hyperthermia Neg Hx   . Pseudochol deficiency Neg Hx     History  Substance Use Topics  . Smoking status: Never Smoker   . Smokeless tobacco: Never Used  . Alcohol Use: No    Allergies: No Known Allergies  Prescriptions prior to admission  Medication Sig Dispense Refill  . HYDROcodone-acetaminophen (VICODIN) 5-500 MG per tablet Take 1-2 tablets by mouth every 6 (six) hours as needed. For severe pain        Review of Systems  Constitutional: Negative for fever, chills and weight loss.  Respiratory: Positive for cough and sputum production. Negative for hemoptysis, shortness of breath and wheezing.   Gastrointestinal: Negative for nausea and vomiting.  Neurological: Negative for weakness.   Physical Exam   Blood pressure 116/73, pulse 88, temperature 98.9 F (37.2 C), temperature source Oral, resp. rate 18, height 5\' 2"  (1.575 m), weight  86.728 kg (191 lb 3.2 oz), last menstrual period 05/17/2010, SpO2 98.00%.  Physical Exam  Constitutional: She is oriented to person, place, and time. She appears well-developed.  Cardiovascular: Normal rate, regular rhythm and normal heart sounds.   Respiratory: Effort normal. No respiratory distress. She has no wheezes. She has rales (LUL).  Neurological: She is alert and oriented to person, place, and time.  Skin: Skin is warm and dry.  Psychiatric: She has a normal mood and affect. Her behavior is normal. Judgment and thought content normal.    MAU Course  Procedures   Assessment and Plan  1.  LUL pneumonia Azithromycin prescribed, as well as guaifenesin and codeine.  F/u with family practice/   Candelaria Celeste JEHIEL 05/22/2011, 3:19 PM

## 2012-06-01 ENCOUNTER — Inpatient Hospital Stay (HOSPITAL_COMMUNITY)
Admission: AD | Admit: 2012-06-01 | Discharge: 2012-06-01 | Disposition: A | Discharge status: Home / Self Care | Payer: Medicaid Other | Source: Ambulatory Visit | Attending: Obstetrics & Gynecology | Admitting: Obstetrics & Gynecology

## 2012-06-01 ENCOUNTER — Encounter (HOSPITAL_COMMUNITY): Payer: Self-pay | Admitting: *Deleted

## 2012-06-01 DIAGNOSIS — R1031 Right lower quadrant pain: Secondary | ICD-10-CM | POA: Insufficient documentation

## 2012-06-01 DIAGNOSIS — N83209 Unspecified ovarian cyst, unspecified side: Secondary | ICD-10-CM | POA: Insufficient documentation

## 2012-06-01 LAB — URINALYSIS, ROUTINE W REFLEX MICROSCOPIC
Nitrite: NEGATIVE
Specific Gravity, Urine: <1.005 — ABNORMAL LOW (ref 1.005–1.030)
Urobilinogen, UA: 0.2 mg/dL (ref 0.0–1.0)
pH: 6 (ref 5.0–8.0)

## 2012-06-01 LAB — WET PREP, GENITAL: Trich, Wet Prep: NONE SEEN

## 2012-06-01 LAB — URINE MICROSCOPIC-ADD ON

## 2012-06-01 LAB — POCT PREGNANCY, URINE: Preg Test, Ur: NEGATIVE

## 2012-06-01 MED ORDER — OXYCODONE-ACETAMINOPHEN 5-325 MG PO TABS
1.0000 | ORAL_TABLET | Freq: Once | ORAL | Status: AC
  Administered 2012-06-01: 1 via ORAL
  Filled 2012-06-01: qty 1

## 2012-06-01 MED ORDER — OXYCODONE-ACETAMINOPHEN 5-325 MG PO TABS: 2.0000 | ORAL_TABLET | ORAL | Status: DC | PRN

## 2012-06-01 MED ORDER — IBUPROFEN 600 MG PO TABS: 600.0000 mg | ORAL_TABLET | Freq: Four times a day (QID) | ORAL | Status: DC | PRN

## 2012-06-01 NOTE — MAU Note (Signed)
Pt dx with right ovarian cyst 05/26/2012 in [place].  Pt having RLQ pain.  Denies bleeding

## 2012-06-01 NOTE — MAU Provider Note (Signed)
History     CSN: [id number]  Arrival date and time: 06/01/12 1718   First Provider Initiated Contact with Patient 06/01/12 1745      Chief Complaint  Patient presents with  . Abdominal Pain  . Ovarian Cyst   HPI Ms. Krista Rowland is a 32 y.o. G2P1011 who presents to MAU today with RLQ pain. The patient states that she was seen by MD in [place] on 05/26/12 and had [place]. The [place] report shows evidence of right ovarian cyst measuring 9 x 6 x 10. The patient rates her pain today at 5/10. She has not tried any pain medication. She denies N/V, fever, UTI symptoms or vaginal bleeding. She is having a new mucus discharge that started today.   OB History   Grav Para Term Preterm Abortions TAB SAB Ect Mult Living   2 1 1  1   1  1       Past Medical History  Diagnosis Date  . No pertinent past medical history   . Gestational diabetes     Past Surgical History  Procedure Laterality Date  . Cesarean section    . Laparoscopy  01/31/2011    Procedure: LAPAROSCOPY OPERATIVE;  Surgeon: Tilda Burrow, MD;  Location: [facility];  Service: Gynecology;  Laterality: Right;  laparoscopic right lateral salpingostomy    Family History  Problem Relation Age of Onset  . Anesthesia problems Neg Hx   . Hypotension Neg Hx   . Malignant hyperthermia Neg Hx   . Pseudochol deficiency Neg Hx     History  Substance Use Topics  . Smoking status: Never Smoker   . Smokeless tobacco: Never Used  . Alcohol Use: No    Allergies: No Known Allergies  Prescriptions prior to admission  Medication Sig Dispense Refill  . guaiFENesin-codeine 100-10 MG/5ML syrup Take 5-10 mLs by mouth 3 (three) times daily as needed.      Marland Kitchen HYDROcodone-acetaminophen (VICODIN) 5-500 MG per tablet Take 1-2 tablets by mouth every 6 (six) hours as needed. For severe pain        Review of Systems  Constitutional: Negative for fever and malaise/fatigue.  Gastrointestinal: Positive for abdominal pain. Negative for nausea, vomiting,  diarrhea and constipation.  Genitourinary: Negative for dysuria, urgency and frequency.       Neg - vaginal bleeding + vaginal discharge   Physical Exam   Blood pressure 121/77, pulse 86, temperature 97.6 F (36.4 C), temperature source Oral, resp. rate 16, height 5\' 2"  (1.575 m), weight 196 lb 6.4 oz (89.086 kg), last menstrual period 05/26/2012.  Physical Exam  Constitutional: She is oriented to person, place, and time. She appears well-developed and well-nourished. No distress.  HENT:  Head: Normocephalic and atraumatic.  Cardiovascular: Normal rate, regular rhythm and normal heart sounds.   Respiratory: Effort normal and breath sounds normal. No respiratory distress.  GI: Soft. Bowel sounds are normal. She exhibits no distension and no mass. There is tenderness (Moderate RLQ tenderness to palpation). There is no rebound and no guarding.  Genitourinary: Uterus is not enlarged and not tender. Cervix exhibits discharge (scant mucus discharge). Cervix exhibits no motion tenderness and no friability. Right adnexum displays tenderness. Right adnexum displays no mass. Left adnexum displays tenderness. Left adnexum displays no mass. No bleeding around the vagina. Vaginal discharge (scant mucus discharge noted) found.  Neurological: She is alert and oriented to person, place, and time.  Skin: Skin is warm and dry. No erythema.  Psychiatric: She has a normal mood  and affect.   Results for orders placed during the hospital encounter of 06/01/12 (from the past 24 hour(s))  URINALYSIS, ROUTINE W REFLEX MICROSCOPIC     Status: Abnormal   Collection Time    06/01/12  5:30 PM      Result Value Range   Color, Urine YELLOW  YELLOW   APPearance CLEAR  CLEAR   Specific Gravity, Urine <1.005 (*) 1.005 - 1.030   pH 6.0  5.0 - 8.0   Glucose, UA NEGATIVE  NEGATIVE mg/dL   Hgb urine dipstick SMALL (*) NEGATIVE   Bilirubin Urine NEGATIVE  NEGATIVE   Ketones, ur NEGATIVE  NEGATIVE mg/dL   Protein, ur  NEGATIVE  NEGATIVE mg/dL   Urobilinogen, UA 0.2  0.0 - 1.0 mg/dL   Nitrite NEGATIVE  NEGATIVE   Leukocytes, UA SMALL (*) NEGATIVE  URINE MICROSCOPIC-ADD ON     Status: Abnormal   Collection Time    06/01/12  5:30 PM      Result Value Range   Squamous Epithelial / LPF FEW (*) RARE   WBC, UA 7-10  <3 WBC/hpf   RBC / HPF 0-2  <3 RBC/hpf  POCT PREGNANCY, URINE     Status: None   Collection Time    06/01/12  5:32 PM      Result Value Range   Preg Test, Ur NEGATIVE  NEGATIVE    MAU Course  Procedures None  MDM UPT, UA, Wet prep and GC/Chlamydia today Discussed patient with Dr. Despina Hidden. Patient will be scheduled for outpatient [place] and follow-up in clinic for further management.  Patient given Percocet for pain - reports significant improvement in symptoms. Assessment and Plan  A: Right ovarian cyst  P: Discharge home Rx for percocet and ibuprofen given to patient Outpatient [place] ordered they will call her with an appointment Patient referred to clinic for follow-up. They will call her with an appoitnment Patient may return to MAU as needed or if her condition were to change or worsen  Freddi Starr, PA-C  06/01/2012, 6:07 PM

## 2012-06-02 LAB — GC/CHLAMYDIA PROBE AMP
CT Probe RNA: NEGATIVE
GC Probe RNA: NEGATIVE

## 2012-06-05 NOTE — MAU Provider Note (Signed)
Attestation of Attending Supervision of Advanced Practitioner (PA/CNM/NP): Evaluation and management procedures were performed by the Advanced Practitioner under my supervision and collaboration.  I have reviewed the Advanced Practitioner's note and chart, and I agree with the management and plan.  Terryon Pineiro, MD, FACOG Attending Obstetrician & Gynecologist Faculty Practice, [facility]  

## 2012-07-04 ENCOUNTER — Encounter: Payer: Medicaid Other | Admitting: Obstetrics & Gynecology

## 2012-12-02 ENCOUNTER — Inpatient Hospital Stay (HOSPITAL_COMMUNITY): Payer: Medicaid Other

## 2012-12-02 ENCOUNTER — Inpatient Hospital Stay (HOSPITAL_COMMUNITY)
Admission: AD | Admit: 2012-12-02 | Discharge: 2012-12-02 | Disposition: A | Discharge status: Home / Self Care | Payer: Medicaid Other | Source: Ambulatory Visit | Attending: Obstetrics & Gynecology | Admitting: Obstetrics & Gynecology

## 2012-12-02 ENCOUNTER — Encounter (HOSPITAL_COMMUNITY): Payer: Self-pay | Admitting: Family

## 2012-12-02 DIAGNOSIS — N831 Corpus luteum cyst of ovary, unspecified side: Secondary | ICD-10-CM | POA: Insufficient documentation

## 2012-12-02 DIAGNOSIS — N949 Unspecified condition associated with female genital organs and menstrual cycle: Secondary | ICD-10-CM | POA: Insufficient documentation

## 2012-12-02 DIAGNOSIS — R1032 Left lower quadrant pain: Secondary | ICD-10-CM | POA: Insufficient documentation

## 2012-12-02 DIAGNOSIS — O34599 Maternal care for other abnormalities of gravid uterus, unspecified trimester: Secondary | ICD-10-CM | POA: Insufficient documentation

## 2012-12-02 DIAGNOSIS — O99891 Other specified diseases and conditions complicating pregnancy: Secondary | ICD-10-CM | POA: Insufficient documentation

## 2012-12-02 DIAGNOSIS — O9989 Other specified diseases and conditions complicating pregnancy, childbirth and the puerperium: Secondary | ICD-10-CM

## 2012-12-02 HISTORY — DX: Unspecified ovarian cyst, unspecified side: N83.209

## 2012-12-02 LAB — WET PREP, GENITAL
Clue Cells Wet Prep HPF POC: NONE SEEN
Trich, Wet Prep: NONE SEEN

## 2012-12-02 LAB — CBC
MCV: 74.5 fL — ABNORMAL LOW (ref 78.0–100.0)
Platelets: 246 10*3/uL (ref 150–400)
RDW: 13.8 % (ref 11.5–15.5)
WBC: 12.1 10*3/uL — ABNORMAL HIGH (ref 4.0–10.5)

## 2012-12-02 LAB — URINALYSIS, ROUTINE W REFLEX MICROSCOPIC
Bilirubin Urine: NEGATIVE
Nitrite: NEGATIVE
Specific Gravity, Urine: >1.03 — ABNORMAL HIGH (ref 1.005–1.030)
Urobilinogen, UA: 0.2 mg/dL (ref 0.0–1.0)

## 2012-12-02 LAB — URINE MICROSCOPIC-ADD ON

## 2012-12-02 NOTE — MAU Note (Signed)
Pt's spouse states had +upt at home. Hx ectopic x2. Is having pain on left side of abdomen noted x2-3 days. Prior ectopic included removal of r fallopian tube. Denies bleeding, no vaginal d/c changes. LMP-10/28/2012

## 2012-12-02 NOTE — MAU Provider Note (Signed)
History     CSN: [id number]  Arrival date and time: 12/02/12 1717   First Provider Initiated Contact with Patient 12/02/12 1829      Chief Complaint  Patient presents with  . Positive home upt, hx ectopic   . Abdominal Pain   HPI Pt is [redacted]w[redacted]d pregnant with hx of 2 ectopic pregnancies.  Pt had one treated with MTX and one treated with Right lateral salpingostomy. Pt and husband concerned with her hx.  Pt does have one pregnancy delivered by C/S. Pt denies spotting or bleeding or vaginal discharge,or UTI sx. Pt does have right abdominal cramping. RN note:   Addendum  MAU Note Service date: 12/02/2012 6:06 PM   32 yo, G4P1, presents to MAU with c/o +HPT and RLQ pain (2/10) x 3 days; reports hx significant for 3 prior ectopic pregnancies (R tube removed). Denies VB.     Past Medical History  Diagnosis Date  . No pertinent past medical history   . Gestational diabetes   . Ovarian cyst     Past Surgical History  Procedure Laterality Date  . Cesarean section    . Laparoscopy  01/31/2011    Procedure: LAPAROSCOPY OPERATIVE;  Surgeon: Tilda Burrow, MD;  Location: [facility];  Service: Gynecology;  Laterality: Right;  laparoscopic right lateral salpingostomy    Family History  Problem Relation Age of Onset  . Anesthesia problems Neg Hx   . Hypotension Neg Hx   . Malignant hyperthermia Neg Hx   . Pseudochol deficiency Neg Hx     History  Substance Use Topics  . Smoking status: Never Smoker   . Smokeless tobacco: Never Used  . Alcohol Use: No    Allergies: No Known Allergies  Prescriptions prior to admission  Medication Sig Dispense Refill  . ibuprofen (ADVIL,MOTRIN) 600 MG tablet Take 1 tablet (600 mg total) by mouth every 6 (six) hours as needed for pain.  30 tablet  0  . oxyCODONE-acetaminophen (PERCOCET/ROXICET) 5-325 MG per tablet Take 2 tablets by mouth every 4 (four) hours as needed for pain.  15 tablet  0    Review of Systems  Constitutional: Negative for  fever and chills.  Gastrointestinal: Positive for abdominal pain. Negative for nausea, vomiting, diarrhea and constipation.  Genitourinary: Negative for dysuria, urgency and frequency.   Physical Exam   Blood pressure 115/76, pulse 107, temperature 98.8 F (37.1 C), temperature source Oral, resp. rate 18, height 5\' 2"  (1.575 m), weight 86.694 kg (191 lb 2 oz), last menstrual period 10/28/2012.  Physical Exam  Nursing note and vitals reviewed. Constitutional: She is oriented to person, place, and time. She appears well-developed and well-nourished. No distress.  Eyes: Pupils are equal, round, and reactive to light.  Neck: Normal range of motion. Neck supple.  Cardiovascular: Normal rate.   Respiratory: Effort normal.  GI: Soft. She exhibits no distension. There is no tenderness. There is no rebound and no guarding.  Genitourinary: Vagina normal and uterus normal. No vaginal discharge found.  Musculoskeletal: Normal range of motion.  Neurological: She is alert and oriented to person, place, and time.  Skin: Skin is warm and dry.  Psychiatric: She has a normal mood and affect.    MAU Course  Procedures Results for orders placed during the hospital encounter of 12/02/12 (from the past 24 hour(s))  URINALYSIS, ROUTINE W REFLEX MICROSCOPIC     Status: Abnormal   Collection Time    12/02/12  6:05 PM      Result  Value Range   Color, Urine YELLOW  YELLOW   APPearance CLEAR  CLEAR   Specific Gravity, Urine >1.030 (*) 1.005 - 1.030   pH 6.0  5.0 - 8.0   Glucose, UA NEGATIVE  NEGATIVE mg/dL   Hgb urine dipstick NEGATIVE  NEGATIVE   Bilirubin Urine NEGATIVE  NEGATIVE   Ketones, ur NEGATIVE  NEGATIVE mg/dL   Protein, ur NEGATIVE  NEGATIVE mg/dL   Urobilinogen, UA 0.2  0.0 - 1.0 mg/dL   Nitrite NEGATIVE  NEGATIVE   Leukocytes, UA TRACE (*) NEGATIVE  URINE MICROSCOPIC-ADD ON     Status: Abnormal   Collection Time    12/02/12  6:05 PM      Result Value Range   Squamous Epithelial / LPF  MANY (*) RARE   WBC, UA 3-6  <3 WBC/hpf   Bacteria, UA RARE  RARE   Urine-Other MUCOUS PRESENT    POCT PREGNANCY, URINE     Status: Abnormal   Collection Time    12/02/12  6:10 PM      Result Value Range   Preg Test, Ur POSITIVE (*) NEGATIVE  CBC     Status: Abnormal   Collection Time    12/02/12  6:23 PM      Result Value Range   WBC 12.1 (*) 4.0 - 10.5 K/uL   RBC 4.66  3.87 - 5.11 MIL/uL   Hemoglobin 11.6 (*) 12.0 - 15.0 g/dL   HCT 16.1 (*) 09.6 - 04.5 %   MCV 74.5 (*) 78.0 - 100.0 fL   MCH 24.9 (*) 26.0 - 34.0 pg   MCHC 33.4  30.0 - 36.0 g/dL   RDW 40.9  81.1 - 91.4 %   Platelets 246  150 - 400 K/uL  HCG, QUANTITATIVE, PREGNANCY     Status: Abnormal   Collection Time    12/02/12  6:23 PM      Result Value Range   hCG, Beta Chain, Quant, S 31 (*) <5 mIU/mL  WET PREP, GENITAL     Status: Abnormal   Collection Time    12/02/12  6:35 PM      Result Value Range   Yeast Wet Prep HPF POC NONE SEEN  NONE SEEN   Trich, Wet Prep NONE SEEN  NONE SEEN   Clue Cells Wet Prep HPF POC NONE SEEN  NONE SEEN   WBC, Wet Prep HPF POC MODERATE (*) NONE SEEN   Care turned over to [facility], CNM  US Ob Comp Less 14 Wks  12/02/2012   CLINICAL DATA:  Left lower quadrant pain. History of ectopic pregnancy.  EXAM: OBSTETRIC <14 WK ULTRASOUND  TECHNIQUE: Transabdominal and transvaginal ultrasound was performed for evaluation of the gestation as well as the maternal uterus and adnexal regions.  COMPARISON:  None.  FINDINGS: Intrauterine gestational sac: None  Yolk sac:  None  Embryo:  None  Cardiac Activity: None  Maternal uterus/adnexae: The right ovary is surgically absent. The ultrasonographer identified a corpus luteum cyst on the left ovary. I do not see an image that demonstrates that finding.  IMPRESSION: No evidence of intrauterine or ectopic pregnancy.   Electronically Signed   By: Geanie Cooley M.D.   On: 12/02/2012 20:37   US Ob Transvaginal  12/02/2012   CLINICAL DATA:  Left lower  quadrant pain. History of ectopic pregnancy.  EXAM: OBSTETRIC <14 WK ULTRASOUND  TECHNIQUE: Transabdominal and transvaginal ultrasound was performed for evaluation of the gestation as well as the maternal uterus and  adnexal regions.  COMPARISON:  None.  FINDINGS: Intrauterine gestational sac: None  Yolk sac:  None  Embryo:  None  Cardiac Activity: None  Maternal uterus/adnexae: The right ovary is surgically absent. The ultrasonographer identified a corpus luteum cyst on the left ovary. I do not see an image that demonstrates that finding.  IMPRESSION: No evidence of intrauterine or ectopic pregnancy.   Electronically Signed   By: Geanie Cooley M.D.   On: 12/02/2012 20:37    Assessment and Plan   1. Pelvic pain complicating pregnancy, antepartum, first trimester    Repeat HCG in 48 hours Return to MAU sooner if needed Ectopic precautions   Tawnya Crook 12/02/2012, 8:26 PM

## 2012-12-02 NOTE — MAU Note (Addendum)
32 yo, G4P1, presents to MAU with c/o +HPT and RLQ pain (2/10) x 3 days; reports hx significant for 3 prior ectopic pregnancies (R tube removed). Denies VB.

## 2012-12-03 LAB — GC/CHLAMYDIA PROBE AMP
CT Probe RNA: NEGATIVE
GC Probe RNA: NEGATIVE

## 2012-12-04 ENCOUNTER — Inpatient Hospital Stay (HOSPITAL_COMMUNITY)
Admission: AD | Admit: 2012-12-04 | Discharge: 2012-12-04 | Disposition: A | Discharge status: Home / Self Care | Payer: Medicaid Other | Source: Ambulatory Visit | Attending: Obstetrics & Gynecology | Admitting: Obstetrics & Gynecology

## 2012-12-04 DIAGNOSIS — O99891 Other specified diseases and conditions complicating pregnancy: Secondary | ICD-10-CM | POA: Insufficient documentation

## 2012-12-04 DIAGNOSIS — O9989 Other specified diseases and conditions complicating pregnancy, childbirth and the puerperium: Secondary | ICD-10-CM

## 2012-12-04 DIAGNOSIS — N949 Unspecified condition associated with female genital organs and menstrual cycle: Secondary | ICD-10-CM

## 2012-12-04 LAB — HCG, QUANTITATIVE, PREGNANCY: hCG, Beta Chain, Quant, S: 82 m[IU]/mL — ABNORMAL HIGH (ref <5)

## 2012-12-04 NOTE — MAU Note (Signed)
Here for follow up. Is ok, feeling "gassy".  No bleeding.

## 2012-12-04 NOTE — MAU Provider Note (Signed)
  History     CSN: [id number]  Arrival date and time: 12/04/12 1303   None     Chief Complaint  Patient presents with  . Follow-up   HPI  Tekeshia Klahr is a 32 y.o. [id number] at [redacted]w[redacted]d who is here for repeat hcg. She was seen two days ago for RLQ, and has a history of ectopic pregnancy. She states that she is feeling well today. She denies any pain or bleeding.   Past Medical History  Diagnosis Date  . No pertinent past medical history   . Gestational diabetes   . Ovarian cyst     Past Surgical History  Procedure Laterality Date  . Cesarean section    . Laparoscopy  01/31/2011    Procedure: LAPAROSCOPY OPERATIVE;  Surgeon: Tilda Burrow, MD;  Location: [facility];  Service: Gynecology;  Laterality: Right;  laparoscopic right lateral salpingostomy    Family History  Problem Relation Age of Onset  . Anesthesia problems Neg Hx   . Hypotension Neg Hx   . Malignant hyperthermia Neg Hx   . Pseudochol deficiency Neg Hx     History  Substance Use Topics  . Smoking status: Never Smoker   . Smokeless tobacco: Never Used  . Alcohol Use: No    Allergies: No Known Allergies  Prescriptions prior to admission  Medication Sig Dispense Refill  . ibuprofen (ADVIL,MOTRIN) 600 MG tablet Take 1 tablet (600 mg total) by mouth every 6 (six) hours as needed for pain.  30 tablet  0  . oxyCODONE-acetaminophen (PERCOCET/ROXICET) 5-325 MG per tablet Take 2 tablets by mouth every 4 (four) hours as needed for pain.  15 tablet  0    ROS Physical Exam   Blood pressure 124/82, pulse 95, temperature 98.4 F (36.9 C), temperature source Oral, resp. rate 18, height 4' 11.5" (1.511 m), weight 86.728 kg (191 lb 3.2 oz), last menstrual period 10/28/2012.  Physical Exam  MAU Course  Procedures  Results for ADALIDA, GARVER (MRN [id number]) as of 12/04/2012 13:53  Ref. Range 12/02/2012 18:23 12/02/2012 18:35 12/02/2012 19:54 12/04/2012 13:06  hCG, Beta Chain, Quant, S Latest Range: <5 mIU/mL 31 (H)   82  (H)   C/W Dr. Debroah Loop, will continue to follow HCG levels.  Assessment and Plan   1. Pelvic pain complicating pregnancy, antepartum, first trimester    Ectopic precautions reviewed  Repeat HCG in 48 hours Return to MAU sooner if needed   Tawnya Crook 12/04/2012, 1:53 PM

## 2012-12-06 ENCOUNTER — Inpatient Hospital Stay (HOSPITAL_COMMUNITY)
Admission: AD | Admit: 2012-12-06 | Discharge: 2012-12-06 | Disposition: A | Discharge status: Home / Self Care | Payer: Medicaid Other | Source: Ambulatory Visit | Attending: Obstetrics & Gynecology | Admitting: Obstetrics & Gynecology

## 2012-12-06 DIAGNOSIS — O0281 Inappropriate change in quantitative human chorionic gonadotropin (hCG) in early pregnancy: Secondary | ICD-10-CM

## 2012-12-06 LAB — HCG, QUANTITATIVE, PREGNANCY: hCG, Beta Chain, Quant, S: 260 m[IU]/mL — ABNORMAL HIGH (ref <5)

## 2012-12-06 NOTE — MAU Note (Signed)
Pt here for f/u BHCG. Pt denies any vag bleeding and reports mild left side pain tha t has not changed from the last time she was here 48hrs ago.

## 2012-12-06 NOTE — MAU Provider Note (Signed)
HPI: [name] is a 32 y.o. female [phone] at [redacted]w[redacted]d who presents for a follow up beta hcg level. She is not having any bleeding, denies abdominal pain, however does requests a list of safe medications to take for gas. Beta hcg level 11/16: 31, 11/18: 82, 11/20: 260.    Objective:  GENERAL: Well-developed, well-nourished female in no acute distress.  HEENT: Normocephalic, atraumatic.   LUNGS: Effort normal HEART: Regular rate  SKIN: Warm, dry and without erythema PSYCH: Normal mood and affect  Filed Vitals:   12/06/12 1730  BP: 126/79  Pulse: 102  Temp: 98.5 F (36.9 C)  TempSrc: Oral  Resp: 18   Results for orders placed during the hospital encounter of 12/06/12 (from the past 24 hour(s))  HCG, QUANTITATIVE, PREGNANCY     Status: Abnormal   Collection Time    12/06/12  5:30 PM      Result Value Range   hCG, Beta Chain, Quant, S 260 (*) <5 mIU/mL    A: 1. Elevated level of quantitative hCG for gestational age in early pregnancy     P:  Discharge home Ectopic precautions discussed at length Return to MAU with any pain or bleeding Follow up US in 7 days; [place] will call to schedule the appointment A list of safe medications in pregnancy provided to the patient  Stop taking Ibuprofen.   Iona Hansen Keina Mutch, NP 12/06/2012 6:25 PM

## 2012-12-09 NOTE — MAU Provider Note (Signed)
Attestation of Attending Supervision of Advanced Practitioner (CNM/NP): Evaluation and management procedures were performed by the Advanced Practitioner under my supervision and collaboration. I have reviewed the Advanced Practitioner's note and chart, and I agree with the management and plan.  Jomarion Mish H. 12:53 PM   

## 2012-12-17 ENCOUNTER — Ambulatory Visit (HOSPITAL_COMMUNITY)
Admission: RE | Admit: 2012-12-17 | Discharge: 2012-12-17 | Disposition: A | Discharge status: Home / Self Care | Payer: Medicaid Other | Source: Ambulatory Visit | Attending: Obstetrics and Gynecology | Admitting: Obstetrics and Gynecology

## 2012-12-17 ENCOUNTER — Other Ambulatory Visit: Payer: Self-pay | Admitting: Advanced Practice Midwife

## 2012-12-17 DIAGNOSIS — O0281 Inappropriate change in quantitative human chorionic gonadotropin (hCG) in early pregnancy: Secondary | ICD-10-CM | POA: Insufficient documentation

## 2012-12-17 DIAGNOSIS — Z3490 Encounter for supervision of normal pregnancy, unspecified, unspecified trimester: Secondary | ICD-10-CM

## 2012-12-17 NOTE — Progress Notes (Signed)
Called by Radiology to review followup ultrasound.   She has been followed with HCG levels and presents to day for [place]  US showed SIUP, tiny embryo (unable to measure FHR) with yolk sac  Reassured this is not an ectopic.   Plans care at [facility]  [place], [place]

## 2013-02-25 ENCOUNTER — Encounter (HOSPITAL_COMMUNITY): Payer: Self-pay | Admitting: Obstetrics and Gynecology

## 2013-02-25 ENCOUNTER — Other Ambulatory Visit (HOSPITAL_COMMUNITY): Payer: Self-pay | Admitting: Obstetrics and Gynecology

## 2013-02-25 DIAGNOSIS — Z3689 Encounter for other specified antenatal screening: Secondary | ICD-10-CM

## 2013-02-25 DIAGNOSIS — O280 Abnormal hematological finding on antenatal screening of mother: Secondary | ICD-10-CM

## 2013-03-05 ENCOUNTER — Ambulatory Visit (HOSPITAL_COMMUNITY)
Admission: RE | Admit: 2013-03-05 | Discharge: 2013-03-05 | Disposition: A | Discharge status: Home / Self Care | Payer: Medicaid Other | Source: Ambulatory Visit | Attending: Obstetrics & Gynecology | Admitting: Obstetrics & Gynecology

## 2013-03-05 ENCOUNTER — Ambulatory Visit (HOSPITAL_COMMUNITY)
Admission: RE | Admit: 2013-03-05 | Discharge: 2013-03-05 | Disposition: A | Discharge status: Home / Self Care | Payer: Medicaid Other | Source: Ambulatory Visit | Attending: Obstetrics and Gynecology | Admitting: Obstetrics and Gynecology

## 2013-03-05 ENCOUNTER — Encounter (HOSPITAL_COMMUNITY): Payer: Self-pay

## 2013-03-05 ENCOUNTER — Other Ambulatory Visit: Payer: Self-pay

## 2013-03-05 VITALS — BP 128/80 | HR 89 | Wt 176.0 lb

## 2013-03-05 DIAGNOSIS — O09299 Supervision of pregnancy with other poor reproductive or obstetric history, unspecified trimester: Secondary | ICD-10-CM | POA: Insufficient documentation

## 2013-03-05 DIAGNOSIS — O289 Unspecified abnormal findings on antenatal screening of mother: Secondary | ICD-10-CM

## 2013-03-05 DIAGNOSIS — O34219 Maternal care for unspecified type scar from previous cesarean delivery: Secondary | ICD-10-CM | POA: Insufficient documentation

## 2013-03-05 DIAGNOSIS — Z3689 Encounter for other specified antenatal screening: Secondary | ICD-10-CM

## 2013-03-05 DIAGNOSIS — O280 Abnormal hematological finding on antenatal screening of mother: Secondary | ICD-10-CM

## 2013-03-05 DIAGNOSIS — O2620 Pregnancy care for patient with recurrent pregnancy loss, unspecified trimester: Secondary | ICD-10-CM | POA: Insufficient documentation

## 2013-03-05 NOTE — ED Notes (Signed)
Genetic Counseling  DOB: [birth year] Referring Provider: Rutha Bouchard, MD Appointment Date: 03/05/2013 Attending: Benjaman Lobe, MD  Krista Rowland and her husband, partner, Krista Rowland, were seen for genetic counseling because of an increased risk for fetal Down syndrome based on an abnormal Quad screen.  They were counseled regarding the Quad screen result and the associated 1 in 207 risk for fetal Down syndrome.  We reviewed chromosomes, nondisjunction, and the common features and variable prognosis of Down syndrome.  In addition, we reviewed the screen adjusted reduction in risks for trisomy 18 and ONTDs.  We also discussed other explanations for a screen positive result including: a gestational dating error, differences in maternal metabolism, and normal variation. They understand that this screening is not diagnostic for Down syndrome but provides a risk assessment.  We reviewed available screening options including noninvasive prenatal screening (NIPS)/cell free fetal DNA (cffDNA) testing, and detailed ultrasound.  They were counseled that screening tests are used to modify a patient's a priori risk for aneuploidy, typically based on age. This estimate provides a pregnancy specific risk assessment. We reviewed the benefits and limitations of each option. Specifically, we discussed the conditions for which each test screens, the detection rates, and false positive rates of each. They were also counseled regarding diagnostic testing via amniocentesis. We reviewed the approximate 1 in [phone] risk for complications for amniocentesis, including spontaneous pregnancy loss.  A complete ultrasound was performed today. The ultrasound report will be sent under separate cover. There were no visualized fetal anomalies or markers suggestive of aneuploidy.  However, ultrasound did adjust the due date to 08/15/13 which required recalculation of her Quad screen. Unfortunately, Krista Rowland was unable to provide  a recalculation over the phone and did not know when they would be able to fax an adjusted risk.  Therefore, it was not possible to counsel them on exactly what the risk for Down syndrome was in the pregnancy.  After consideration of all the options, and a clear understand of the limitations on what the exact risk for Down syndrome was, they elected to proceed with NIPS.  Those results will be available in 8-10 days.   They understands that screening tests cannot rule out all birth defects or genetic syndromes.   Both family histories were reviewed and found to be noncontributory for birth defects, mental retardation or known genetic conditions. However, Krista Rowland and her husband are both Krista Rowland. We discussed that persons of Krista Rowland descent are at increased risk for certain hemoglobinopathies. Specifically, they are at an increased risk for beta thalassemia and Hemoglobin E (Hb E). Both of these are autosomal recessive conditions and involve abnormalities in the beta globin gene on chromosome 11. Hb E is especially significant when combined with beta thalassemia trait (Hb E/ beta-thal). This condition is highly variable, but may result in clinical symptoms similar to beta thalassemia major. Hb EE (Hb E inherited from each parent) is generally benign. Persons from Krist[place] Row[place] are also at increased risk for alpha thalassemia, also an autosomal recessive condition. Alpha thalassemia is different in its inheritance as there are two alpha globin genes on each chromosome 16 (aa/aa). A person can be a carrier of one alpha gene mutation (aa/a-) or of more than one mutation. A person who carries two alpha globin gene mutations can either carry them in cis (both on the same chromosome aa/--) or in trans (on different chromosomes a-/a-) . Krista Rowland [place] alpha thalassemia carriers usually have a cis arrangement (  aa/--) of their alpha globin gene mutations. Thus, carriers are at risk for having a  child with the most severe form of alpha thalassemia, hydrops fetalis with Hb Barts, which is associated with an absence of alpha globin chain synthesis as a result of deletions of all four alpha globin genes (--/--). There is also a non-deleted alpha chain hemoglobin variant, Hemoglobin Constant Spring (Hb CS) that is relatively common among Krista Rowland. Hb CS can lead to Hemoglobin H disease when inherited along with alpha thalassemia trait (aa/--). Hemoglobinopathy carrier rates in the Krista Row[place] are quite variable and difficult to quantitate specifically. Thus, screening for hemoglobinopathies is recommended for this population group by CBC and quantitative hemoglobin electrophoresis. As iron deficiency is also associated with a low MCV, this should be ruled out by simultaneous serum ferritin studies.  If this testing has not previously been done we would suggest making it available to Krista Rowland.  Without further information regarding the provided family history, an accurate genetic risk cannot be calculated. Further genetic counseling is warranted if more information is obtained.  Krista Rowland denied exposure to environmental toxins or chemical agents. She denied the use of alcohol, tobacco or street drugs. She denied significant viral illnesses during the course of her pregnancy. Her medical and surgical histories were noncontributory.   I counseled this couple for approximately 35 minutes regarding the above risks and available options.   Krista Hai, MS,  Certified Genetic Counselor

## 2013-03-12 ENCOUNTER — Telehealth (HOSPITAL_COMMUNITY): Payer: Self-pay | Admitting: Maternal and Fetal Medicine

## 2013-03-12 NOTE — Telephone Encounter (Signed)
Called Krista Rowland to discuss her cell free fetal DNA test results.  Mrs. Krista Rowland had Panorama testing through [facility].  Testing was offered because of an abnormal Quad screen.   The patient had requested that her husband would be called with the results as [place] would be [place] of [place].  Her husband was identified by name and DOB and also verified her name and DOB.  We reviewed that these are within normal limits, showing a less than 1 in 10,000 risk for trisomies 21, 18 and 13, and monosomy X (Turner syndrome).  In addition, the risk for triploidy/vanishing twin and sex chromosome trisomies (47,XXX and 47,XXY) was also low risk.   We reviewed that this testing identifies > 99% of pregnancies with trisomy 26, trisomy 19, sex chromosome trisomies (47,XXX and 47,XXY), and triploidy. The detection rate for trisomy 18 is 96%.  The detection rate for monosomy X is ~92%.  The false positive rate is <0.1% for all conditions. Testing was also consistent with female gender.  The patient did wish to know gender.  She understands that this testing does not identify all genetic conditions.  All questions were answered to her satisfaction, she was encouraged to call with additional questions or concerns.  Cam Hai, MS Certified Genetic Counselor

## 2013-04-16 ENCOUNTER — Ambulatory Visit (HOSPITAL_COMMUNITY)
Admission: RE | Admit: 2013-04-16 | Discharge: 2013-04-16 | Disposition: A | Discharge status: Home / Self Care | Payer: Medicaid Other | Source: Ambulatory Visit | Attending: Obstetrics and Gynecology | Admitting: Obstetrics and Gynecology

## 2013-04-16 ENCOUNTER — Encounter: Payer: Self-pay | Admitting: Obstetrics & Gynecology

## 2013-04-16 DIAGNOSIS — O34219 Maternal care for unspecified type scar from previous cesarean delivery: Secondary | ICD-10-CM | POA: Insufficient documentation

## 2013-04-16 DIAGNOSIS — O2620 Pregnancy care for patient with recurrent pregnancy loss, unspecified trimester: Secondary | ICD-10-CM | POA: Insufficient documentation

## 2013-04-16 DIAGNOSIS — O289 Unspecified abnormal findings on antenatal screening of mother: Secondary | ICD-10-CM | POA: Insufficient documentation

## 2013-04-16 DIAGNOSIS — O280 Abnormal hematological finding on antenatal screening of mother: Secondary | ICD-10-CM

## 2013-04-16 DIAGNOSIS — O09299 Supervision of pregnancy with other poor reproductive or obstetric history, unspecified trimester: Secondary | ICD-10-CM | POA: Insufficient documentation

## 2013-04-16 DIAGNOSIS — Z3490 Encounter for supervision of normal pregnancy, unspecified, unspecified trimester: Secondary | ICD-10-CM | POA: Insufficient documentation

## 2013-10-07 ENCOUNTER — Encounter (HOSPITAL_COMMUNITY): Payer: Self-pay | Admitting: *Deleted

## 2013-11-18 ENCOUNTER — Encounter (HOSPITAL_COMMUNITY): Payer: Self-pay | Admitting: *Deleted

## 2014-03-14 ENCOUNTER — Encounter (HOSPITAL_COMMUNITY): Payer: Self-pay | Admitting: *Deleted

## 2014-03-14 ENCOUNTER — Inpatient Hospital Stay (HOSPITAL_COMMUNITY)
Admission: AD | Admit: 2014-03-14 | Discharge: 2014-03-15 | Disposition: A | Discharge status: Home / Self Care | Payer: Medicaid Other | Source: Ambulatory Visit | Attending: Family Medicine | Admitting: Family Medicine

## 2014-03-14 DIAGNOSIS — O2342 Unspecified infection of urinary tract in pregnancy, second trimester: Secondary | ICD-10-CM | POA: Diagnosis not present

## 2014-03-14 DIAGNOSIS — Z3A19 19 weeks gestation of pregnancy: Secondary | ICD-10-CM | POA: Insufficient documentation

## 2014-03-14 DIAGNOSIS — N390 Urinary tract infection, site not specified: Secondary | ICD-10-CM

## 2014-03-14 DIAGNOSIS — B3731 Acute candidiasis of vulva and vagina: Secondary | ICD-10-CM

## 2014-03-14 DIAGNOSIS — O98812 Other maternal infectious and parasitic diseases complicating pregnancy, second trimester: Secondary | ICD-10-CM | POA: Diagnosis not present

## 2014-03-14 DIAGNOSIS — N898 Other specified noninflammatory disorders of vagina: Secondary | ICD-10-CM | POA: Diagnosis present

## 2014-03-14 DIAGNOSIS — Z3A20 20 weeks gestation of pregnancy: Secondary | ICD-10-CM

## 2014-03-14 DIAGNOSIS — B373 Candidiasis of vulva and vagina: Secondary | ICD-10-CM | POA: Insufficient documentation

## 2014-03-14 LAB — WET PREP, GENITAL
CLUE CELLS WET PREP: NONE SEEN
Trich, Wet Prep: NONE SEEN
YEAST WET PREP: NONE SEEN

## 2014-03-14 LAB — URINALYSIS, ROUTINE W REFLEX MICROSCOPIC
BILIRUBIN URINE: NEGATIVE
Glucose, UA: NEGATIVE mg/dL
HGB URINE DIPSTICK: NEGATIVE
Ketones, ur: NEGATIVE mg/dL
NITRITE: NEGATIVE
PROTEIN: NEGATIVE mg/dL
SPECIFIC GRAVITY, URINE: 1.01 (ref 1.005–1.030)
Urobilinogen, UA: 0.2 mg/dL (ref 0.0–1.0)
pH: 5.5 (ref 5.0–8.0)

## 2014-03-14 LAB — URINE MICROSCOPIC-ADD ON

## 2014-03-14 MED ORDER — PHENAZOPYRIDINE HCL 100 MG PO TABS
100.0000 mg | ORAL_TABLET | Freq: Once | ORAL | Status: AC
  Administered 2014-03-15: 100 mg via ORAL
  Filled 2014-03-14: qty 1

## 2014-03-14 MED ORDER — FLUCONAZOLE 150 MG PO TABS
150.0000 mg | ORAL_TABLET | Freq: Once | ORAL | Status: AC
  Administered 2014-03-15: 150 mg via ORAL
  Filled 2014-03-14: qty 1

## 2014-03-14 NOTE — MAU Provider Note (Signed)
History     CSN: [id number]  Arrival date and time: 03/14/14 2257   First Provider Initiated Contact with Patient 03/14/14 2329      Chief Complaint  Patient presents with  . Vaginal Itching  . Vaginal Discharge  . Abdominal Pain   HPI   Ms. Krista Rowland is a 34 y.o. female [phone] at [redacted]w[redacted]d who presents with burning in the vaginal area. The burning is worse when she urinates. She also complains of vaginal itching and vaginal discharge.   She is getting her care in [place]. She came here because she thought the other hospital would be too busy. She is receiving prenatal care regularly.   OB History    Gravida Para Term Preterm AB TAB SAB Ectopic Multiple Living   6 1 1  3   3  1       Past Medical History  Diagnosis Date  . No pertinent past medical history   . Gestational diabetes   . Ovarian cyst     Past Surgical History  Procedure Laterality Date  . Cesarean section    . Laparoscopy  01/31/2011    Procedure: LAPAROSCOPY OPERATIVE;  Surgeon: Krista Kind, MD;  Location: [facility];  Service: Gynecology;  Laterality: Right;  laparoscopic right lateral salpingostomy    Family History  Problem Relation Age of Onset  . Anesthesia problems Neg Hx   . Hypotension Neg Hx   . Malignant hyperthermia Neg Hx   . Pseudochol deficiency Neg Hx     History  Substance Use Topics  . Smoking status: Never Smoker   . Smokeless tobacco: Never Used  . Alcohol Use: No    Allergies: No Known Allergies  Prescriptions prior to admission  Medication Sig Dispense Refill Last Dose  . Prenatal Vit-Fe Fumarate-FA (PRENATAL MULTIVITAMIN) TABS tablet Take 1 tablet by mouth daily at 12 noon.   03/14/2014 at Unknown time  . oxyCODONE-acetaminophen (PERCOCET/ROXICET) 5-325 MG per tablet Take 2 tablets by mouth every 4 (four) hours as needed for pain. 15 tablet 0 Not Taking   Results for orders placed or performed during the hospital encounter of 03/14/14 (from the past 48 hour(s))   Urinalysis, Routine w reflex microscopic     Status: Abnormal   Collection Time: 03/14/14 11:02 PM  Result Value Ref Range   Color, Urine YELLOW YELLOW   APPearance CLEAR CLEAR   Specific Gravity, Urine 1.010 1.005 - 1.030   pH 5.5 5.0 - 8.0   Glucose, UA NEGATIVE NEGATIVE mg/dL   Hgb urine dipstick NEGATIVE NEGATIVE   Bilirubin Urine NEGATIVE NEGATIVE   Ketones, ur NEGATIVE NEGATIVE mg/dL   Protein, ur NEGATIVE NEGATIVE mg/dL   Urobilinogen, UA 0.2 0.0 - 1.0 mg/dL   Nitrite NEGATIVE NEGATIVE   Leukocytes, UA MODERATE (A) NEGATIVE  Urine microscopic-add on     Status: Abnormal   Collection Time: 03/14/14 11:02 PM  Result Value Ref Range   Squamous Epithelial / LPF RARE RARE   WBC, UA 11-20 <3 WBC/hpf   RBC / HPF 0-2 <3 RBC/hpf   Bacteria, UA FEW (A) RARE  Wet prep, genital     Status: Abnormal   Collection Time: 03/14/14 11:38 PM  Result Value Ref Range   Yeast Wet Prep HPF POC NONE SEEN NONE SEEN   Trich, Wet Prep NONE SEEN NONE SEEN   Clue Cells Wet Prep HPF POC NONE SEEN NONE SEEN   WBC, Wet Prep HPF POC MODERATE (A) NONE SEEN  Comment: MODERATE BACTERIA SEEN    Review of Systems  Constitutional: Positive for chills. Negative for fever.  Gastrointestinal: Positive for abdominal pain. Negative for nausea and vomiting.  Genitourinary: Positive for dysuria, urgency and frequency. Negative for hematuria.  Musculoskeletal: Positive for back pain.   Physical Exam   Blood pressure 141/83, pulse 90, temperature 97.7 F (36.5 C), temperature source Core (Comment), resp. rate 20, height 5\' 2"  (1.575 m), weight 88.179 kg (194 lb 6.4 oz), unknown if currently breastfeeding.  Physical Exam  Constitutional: She appears well-developed and well-nourished. No distress.  HENT:  Head: Normocephalic.  Eyes: Pupils are equal, round, and reactive to light.  Neck: Neck supple.  GI: Soft. There is tenderness in the right lower quadrant, suprapubic area and left lower quadrant. There  is no rigidity, no guarding and no CVA tenderness.  Genitourinary:  Speculum exam: Vagina - Small amount of thick white discharge, no odor Cervix - No contact bleeding, no active bleeding  Bimanual exam: Cervix closed, thick, posterior  +suprapubic tenderness  GC/Chlam, wet prep done Chaperone present for exam.   Skin: She is not diaphoretic.    MAU Course  Procedures  None  MDM Diflucan 150 mg PO  + fetal heart tones  Urine culture pending  Wet prep negative for yeast; I will treat based on physical exam and symptoms.   Assessment and Plan   A:  1. Yeast vaginitis   2. UTI (lower urinary tract infection)    P:  Discharge home in stable condition RX: Diflucan, macrobid Urine culture pending  Follow up with OB Dr as scheduled   Krista Hillock [name], NP 03/15/2014 12:24 AM

## 2014-03-14 NOTE — MAU Note (Signed)
About a wk ago started having lower abd pain, white vag d/c and vag itching. Denies vag bleeding

## 2014-03-15 MED ORDER — NITROFURANTOIN MONOHYD MACRO 100 MG PO CAPS: 100.0000 mg | ORAL_CAPSULE | Freq: Two times a day (BID) | ORAL | Status: DC

## 2014-03-15 MED ORDER — FLUCONAZOLE 150 MG PO TABS: 150.0000 mg | ORAL_TABLET | Freq: Every day | ORAL | Status: DC

## 2014-03-15 NOTE — Discharge Instructions (Signed)

## 2014-03-17 LAB — CULTURE, OB URINE
Colony Count: >=100000
Special Requests: NORMAL

## 2014-03-17 LAB — GC/CHLAMYDIA PROBE AMP (~~LOC~~) NOT AT ARMC
Chlamydia: NEGATIVE
NEISSERIA GONORRHEA: NEGATIVE

## 2014-03-18 DIAGNOSIS — N390 Urinary tract infection, site not specified: Secondary | ICD-10-CM

## 2015-01-17 ENCOUNTER — Encounter (HOSPITAL_COMMUNITY): Payer: Self-pay | Admitting: *Deleted

## 2015-06-04 ENCOUNTER — Inpatient Hospital Stay (HOSPITAL_COMMUNITY)
Admission: AD | Admit: 2015-06-04 | Discharge: 2015-06-04 | Disposition: A | Discharge status: Home / Self Care | Payer: Medicaid Other | Source: Ambulatory Visit | Attending: Obstetrics & Gynecology | Admitting: Obstetrics & Gynecology

## 2015-06-04 DIAGNOSIS — B373 Candidiasis of vulva and vagina: Secondary | ICD-10-CM

## 2015-06-04 DIAGNOSIS — N898 Other specified noninflammatory disorders of vagina: Secondary | ICD-10-CM | POA: Diagnosis present

## 2015-06-04 DIAGNOSIS — B3731 Acute candidiasis of vulva and vagina: Secondary | ICD-10-CM

## 2015-06-04 DIAGNOSIS — Z3202 Encounter for pregnancy test, result negative: Secondary | ICD-10-CM | POA: Diagnosis not present

## 2015-06-04 LAB — URINALYSIS, ROUTINE W REFLEX MICROSCOPIC
BILIRUBIN URINE: NEGATIVE
GLUCOSE, UA: NEGATIVE mg/dL
KETONES UR: NEGATIVE mg/dL
Nitrite: NEGATIVE
Protein, ur: NEGATIVE mg/dL
Specific Gravity, Urine: <1.005 — ABNORMAL LOW (ref 1.005–1.030)
pH: 5 (ref 5.0–8.0)

## 2015-06-04 LAB — POCT PREGNANCY, URINE: PREG TEST UR: NEGATIVE

## 2015-06-04 LAB — URINE MICROSCOPIC-ADD ON

## 2015-06-04 LAB — WET PREP, GENITAL
Clue Cells Wet Prep HPF POC: NONE SEEN
Sperm: NONE SEEN
TRICH WET PREP: NONE SEEN
Yeast Wet Prep HPF POC: NONE SEEN

## 2015-06-04 MED ORDER — TERCONAZOLE 0.4 % VA CREA: 1.0000 | TOPICAL_CREAM | Freq: Every day | VAGINAL | Status: DC

## 2015-06-04 MED ORDER — IBUPROFEN 800 MG PO TABS: 800.0000 mg | ORAL_TABLET | Freq: Three times a day (TID) | ORAL | Status: DC | PRN

## 2015-06-04 NOTE — Discharge Instructions (Signed)

## 2015-06-04 NOTE — MAU Note (Signed)
Pt presents with complaint of UTI, states she has urgency but voiding small amount and burning with urination.

## 2015-06-04 NOTE — MAU Provider Note (Signed)
History     CSN: [id number]  Arrival date and time: 06/04/15 1956   First Provider Initiated Contact with Patient 06/04/15 2123      Chief Complaint  Patient presents with  . Vaginal Discharge   HPI Comments: Krista Rowland is a 35 y.o. [id number] who presents today with itching, burning and pain with urination x 3 days.   Vaginal Discharge The patient's primary symptoms include vaginal discharge. This is a new problem. Episode onset: 3 days ago. The problem has been unchanged. The pain is moderate. The problem affects both sides. She is not pregnant. Associated symptoms include dysuria. Pertinent negatives include no abdominal pain, chills, constipation, diarrhea, fever, frequency, nausea, urgency or vomiting. The vaginal discharge was normal. There has been no bleeding. Nothing aggravates the symptoms. She has tried nothing for the symptoms.    Past Medical History  Diagnosis Date  . No pertinent past medical history   . Gestational diabetes   . Ovarian cyst     Past Surgical History  Procedure Laterality Date  . Cesarean section    . Laparoscopy  01/31/2011    Procedure: LAPAROSCOPY OPERATIVE;  Surgeon: Jonnie Kind, MD;  Location: [facility];  Service: Gynecology;  Laterality: Right;  laparoscopic right lateral salpingostomy    Family History  Problem Relation Age of Onset  . Anesthesia problems Neg Hx   . Hypotension Neg Hx   . Malignant hyperthermia Neg Hx   . Pseudochol deficiency Neg Hx     Social History  Substance Use Topics  . Smoking status: Never Smoker   . Smokeless tobacco: Never Used  . Alcohol Use: No    Allergies: No Known Allergies  Prescriptions prior to admission  Medication Sig Dispense Refill Last Dose  . fluconazole (DIFLUCAN) 150 MG tablet Take 1 tablet (150 mg total) by mouth daily. 1 tablet 0   . nitrofurantoin, macrocrystal-monohydrate, (MACROBID) 100 MG capsule Take 1 capsule (100 mg total) by mouth 2 (two) times daily. 10 capsule 0   .  oxyCODONE-acetaminophen (PERCOCET/ROXICET) 5-325 MG per tablet Take 2 tablets by mouth every 4 (four) hours as needed for pain. 15 tablet 0 Not Taking  . Prenatal Vit-Fe Fumarate-FA (PRENATAL MULTIVITAMIN) TABS tablet Take 1 tablet by mouth daily at 12 noon.   03/14/2014 at Unknown time    Review of Systems  Constitutional: Negative for fever and chills.  Gastrointestinal: Negative for nausea, vomiting, abdominal pain, diarrhea and constipation.  Genitourinary: Positive for dysuria and vaginal discharge. Negative for urgency and frequency.   Physical Exam   Blood pressure 125/74, pulse 94, temperature 98.1 F (36.7 C), temperature source Oral, resp. rate 16, height 5' (1.524 m), weight 87.544 kg (193 lb), last menstrual period 05/10/2015, SpO2 99 %, unknown if currently breastfeeding.  Physical Exam  Nursing note and vitals reviewed. Constitutional: She is oriented to person, place, and time. She appears well-developed and well-nourished. No distress.  HENT:  Head: Normocephalic.  Cardiovascular: Normal rate.   Respiratory: Effort normal.  GI: Soft. There is no tenderness.  Musculoskeletal: Normal range of motion.  Neurological: She is alert and oriented to person, place, and time.  Skin: Skin is warm.  Psychiatric: She has a normal mood and affect.   Results for orders placed or performed during the hospital encounter of 06/04/15 (from the past 24 hour(s))  Urinalysis, Routine w reflex microscopic (not at [facility])     Status: Abnormal   Collection Time: 06/04/15  8:20 PM  Result Value Ref Range  Color, Urine YELLOW YELLOW   APPearance CLEAR CLEAR   Specific Gravity, Urine <1.005 (L) 1.005 - 1.030   pH 5.0 5.0 - 8.0   Glucose, UA NEGATIVE NEGATIVE mg/dL   Hgb urine dipstick LARGE (A) NEGATIVE   Bilirubin Urine NEGATIVE NEGATIVE   Ketones, ur NEGATIVE NEGATIVE mg/dL   Protein, ur NEGATIVE NEGATIVE mg/dL   Nitrite NEGATIVE NEGATIVE   Leukocytes, UA LARGE (A) NEGATIVE  Urine  microscopic-add on     Status: Abnormal   Collection Time: 06/04/15  8:20 PM  Result Value Ref Range   Squamous Epithelial / LPF 0-5 (A) NONE SEEN   WBC, UA 6-30 0 - 5 WBC/hpf   RBC / HPF 6-30 0 - 5 RBC/hpf   Bacteria, UA FEW (A) NONE SEEN  Pregnancy, urine POC     Status: None   Collection Time: 06/04/15  8:33 PM  Result Value Ref Range   Preg Test, Ur NEGATIVE NEGATIVE  Wet prep, genital     Status: Abnormal   Collection Time: 06/04/15  9:01 PM  Result Value Ref Range   Yeast Wet Prep HPF POC NONE SEEN NONE SEEN   Trich, Wet Prep NONE SEEN NONE SEEN   Clue Cells Wet Prep HPF POC NONE SEEN NONE SEEN   WBC, Wet Prep HPF POC MODERATE (A) NONE SEEN   Sperm NONE SEEN      MAU Course  Procedures  MDM  Assessment and Plan   1. Yeast infection involving the vagina and surrounding area    DC home Comfort measures reviewed  RX: ibuprofen 800mg  PRN, Terazol 7 as directed.  Return to MAU as needed FU with OB as planned  Follow-up Information    Follow up with [facility].   Contact information:   [place] [address] [phone]         Mathis Bud 06/04/2015, 9:26 PM

## 2015-06-05 LAB — GC/CHLAMYDIA PROBE AMP (~~LOC~~) NOT AT ARMC
Chlamydia: NEGATIVE
Neisseria Gonorrhea: NEGATIVE

## 2015-06-07 LAB — URINE CULTURE

## 2015-06-08 ENCOUNTER — Telehealth: Payer: Self-pay | Admitting: Obstetrics and Gynecology

## 2015-06-08 MED ORDER — NITROFURANTOIN MONOHYD MACRO 100 MG PO CAPS: 100.0000 mg | ORAL_CAPSULE | Freq: Two times a day (BID) | ORAL | Status: DC

## 2015-06-08 NOTE — Telephone Encounter (Signed)
+   urine culture, sensitive to Macrobid.  Macrobid called into pharmacy.

## 2015-10-27 ENCOUNTER — Emergency Department (HOSPITAL_BASED_OUTPATIENT_CLINIC_OR_DEPARTMENT_OTHER): Payer: Self-pay

## 2015-10-27 ENCOUNTER — Emergency Department (HOSPITAL_BASED_OUTPATIENT_CLINIC_OR_DEPARTMENT_OTHER)
Admission: EM | Admit: 2015-10-27 | Discharge: 2015-10-27 | Disposition: A | Discharge status: Home / Self Care | Payer: Self-pay | Attending: Emergency Medicine | Admitting: Emergency Medicine

## 2015-10-27 ENCOUNTER — Encounter (HOSPITAL_BASED_OUTPATIENT_CLINIC_OR_DEPARTMENT_OTHER): Payer: Self-pay | Admitting: *Deleted

## 2015-10-27 DIAGNOSIS — R102 Pelvic and perineal pain: Secondary | ICD-10-CM | POA: Insufficient documentation

## 2015-10-27 DIAGNOSIS — R1084 Generalized abdominal pain: Secondary | ICD-10-CM | POA: Insufficient documentation

## 2015-10-27 DIAGNOSIS — R109 Unspecified abdominal pain: Secondary | ICD-10-CM

## 2015-10-27 LAB — COMPREHENSIVE METABOLIC PANEL
ALT: 12 U/L — ABNORMAL LOW (ref 14–54)
ANION GAP: 5 (ref 5–15)
AST: 17 U/L (ref 15–41)
Albumin: 4 g/dL (ref 3.5–5.0)
Alkaline Phosphatase: 52 U/L (ref 38–126)
BILIRUBIN TOTAL: 0.5 mg/dL (ref 0.3–1.2)
BUN: 11 mg/dL (ref 6–20)
CO2: 26 mmol/L (ref 22–32)
Calcium: 9.3 mg/dL (ref 8.9–10.3)
Chloride: 106 mmol/L (ref 101–111)
Creatinine, Ser: 0.58 mg/dL (ref 0.44–1.00)
GFR calc Af Amer: >60 mL/min (ref >60)
Glucose, Bld: 100 mg/dL — ABNORMAL HIGH (ref 65–99)
POTASSIUM: 3.7 mmol/L (ref 3.5–5.1)
Sodium: 137 mmol/L (ref 135–145)
TOTAL PROTEIN: 7.4 g/dL (ref 6.5–8.1)

## 2015-10-27 LAB — CBC WITH DIFFERENTIAL/PLATELET
Basophils Absolute: 0 10*3/uL (ref 0.0–0.1)
Basophils Relative: 0 %
Eosinophils Absolute: 0.1 10*3/uL (ref 0.0–0.7)
Eosinophils Relative: 1 %
HEMATOCRIT: 36.7 % (ref 36.0–46.0)
Hemoglobin: 12.4 g/dL (ref 12.0–15.0)
LYMPHS PCT: 40 %
Lymphs Abs: 3.1 10*3/uL (ref 0.7–4.0)
MCH: 26.6 pg (ref 26.0–34.0)
MCHC: 33.8 g/dL (ref 30.0–36.0)
MCV: 78.8 fL (ref 78.0–100.0)
MONO ABS: 0.5 10*3/uL (ref 0.1–1.0)
MONOS PCT: 6 %
NEUTROS ABS: 4.1 10*3/uL (ref 1.7–7.7)
Neutrophils Relative %: 53 %
Platelets: 236 10*3/uL (ref 150–400)
RBC: 4.66 MIL/uL (ref 3.87–5.11)
RDW: 13.5 % (ref 11.5–15.5)
WBC: 7.8 10*3/uL (ref 4.0–10.5)

## 2015-10-27 LAB — URINALYSIS, ROUTINE W REFLEX MICROSCOPIC
Bilirubin Urine: NEGATIVE
GLUCOSE, UA: NEGATIVE mg/dL
HGB URINE DIPSTICK: NEGATIVE
Ketones, ur: NEGATIVE mg/dL
Leukocytes, UA: NEGATIVE
NITRITE: NEGATIVE
PROTEIN: NEGATIVE mg/dL
Specific Gravity, Urine: 1.012 (ref 1.005–1.030)
pH: 6 (ref 5.0–8.0)

## 2015-10-27 LAB — LIPASE, BLOOD: LIPASE: 27 U/L (ref 11–51)

## 2015-10-27 LAB — PREGNANCY, URINE: Preg Test, Ur: NEGATIVE

## 2015-10-27 MED ORDER — IOPAMIDOL (ISOVUE-300) INJECTION 61%
100.0000 mL | Freq: Once | INTRAVENOUS | Status: AC | PRN
  Administered 2015-10-27: 100 mL via INTRAVENOUS

## 2015-10-27 MED ORDER — MORPHINE SULFATE (PF) 4 MG/ML IV SOLN
4.0000 mg | Freq: Once | INTRAVENOUS | Status: AC
  Administered 2015-10-27: 4 mg via INTRAVENOUS
  Filled 2015-10-27: qty 1

## 2015-10-27 MED ORDER — DIPHENHYDRAMINE HCL 50 MG/ML IJ SOLN
25.0000 mg | Freq: Once | INTRAMUSCULAR | Status: AC
  Administered 2015-10-27: 25 mg via INTRAVENOUS
  Filled 2015-10-27: qty 1

## 2015-10-27 MED ORDER — GI COCKTAIL ~~LOC~~
30.0000 mL | Freq: Once | ORAL | Status: AC
  Administered 2015-10-27: 30 mL via ORAL
  Filled 2015-10-27: qty 30

## 2015-10-27 MED ORDER — KETOROLAC TROMETHAMINE 15 MG/ML IJ SOLN
15.0000 mg | Freq: Once | INTRAMUSCULAR | Status: AC
Start: 2015-10-27 — End: 2015-10-27
  Administered 2015-10-27: 15 mg via INTRAVENOUS
  Filled 2015-10-27: qty 1

## 2015-10-27 MED ORDER — DICYCLOMINE HCL 20 MG PO TABS: 20.0000 mg | ORAL_TABLET | Freq: Three times a day (TID) | ORAL | 0 refills | Status: AC

## 2015-10-27 MED ORDER — NAPROXEN 375 MG PO TABS: 375.0000 mg | ORAL_TABLET | Freq: Two times a day (BID) | ORAL | 0 refills | Status: AC | PRN

## 2015-10-27 MED ORDER — METOCLOPRAMIDE HCL 5 MG/ML IJ SOLN
10.0000 mg | Freq: Once | INTRAMUSCULAR | Status: AC
  Administered 2015-10-27: 10 mg via INTRAVENOUS
  Filled 2015-10-27: qty 2

## 2015-10-27 MED FILL — DICYCLOMINE 20 MG TABLET: 20 | 10 days supply | Qty: 40 | Fill #0

## 2015-10-27 MED FILL — NAPROXEN 375 MG TABLET: 375 | 10 days supply | Qty: 20 | Fill #0

## 2015-10-27 NOTE — ED Notes (Signed)
Patient transported to CT 

## 2015-10-27 NOTE — ED Provider Notes (Signed)
[facility] DEPT [facility] Provider Note   CSN: [id number] Arrival date & time: 10/27/15  0918     History   Chief Complaint Chief Complaint  Patient presents with  . Back Pain    HPI Krista Rowland is a 35 y.o. female.  HPI 35 year old female with past medical history of ovarian cyst who presents with a several month history of intermittent abdominal pain. The patient states that she has had intermittent left lower quadrant abdominal pain for the last several months. The pain comes and goes. Over the last 4 weeks, she has had constant suprapubic as well as worsening left lower quadrant and left flank pain. She is also developed right lower quadrant abdominal pain. Denies any fevers or chills. She states her urine appears darker but denies any dysuria. Denies any vaginal bleeding or discharge. She is sexually active with her husband only. Denies any vomiting. She does note intermittent changes in her bowel movements which are associated with her pain. Denies any alleviating factors.  Past Medical History:  Diagnosis Date  . Gestational diabetes   . No pertinent past medical history   . Ovarian cyst     Patient Active Problem List   Diagnosis Date Noted  . UTI (lower urinary tract infection) 03/18/2014  . Supervision of low-risk pregnancy 04/16/2013    Past Surgical History:  Procedure Laterality Date  . CESAREAN SECTION    . CHOLECYSTECTOMY    . LAPAROSCOPY  01/31/2011   Procedure: LAPAROSCOPY OPERATIVE;  Surgeon: Jonnie Kind, MD;  Location: [facility];  Service: Gynecology;  Laterality: Right;  laparoscopic right lateral salpingostomy    OB History    Gravida Para Term Preterm AB Living   6 1 1   3 1    SAB TAB Ectopic Multiple Live Births       3   1       Home Medications    Prior to Admission medications   Medication Sig Start Date End Date Taking? Authorizing Provider  dicyclomine (BENTYL) 20 MG tablet Take 1 tablet (20 mg total) by mouth 4 (four) times daily -   before meals and at bedtime. 10/27/15 11/06/15  Duffy Bruce, MD  naproxen (NAPROSYN) 375 MG tablet Take 1 tablet (375 mg total) by mouth 2 (two) times daily as needed for moderate pain. 10/27/15 11/03/15  Duffy Bruce, MD    Family History Family History  Problem Relation Age of Onset  . Anesthesia problems Neg Hx   . Hypotension Neg Hx   . Malignant hyperthermia Neg Hx   . Pseudochol deficiency Neg Hx     Social History Social History  Substance Use Topics  . Smoking status: Never Smoker  . Smokeless tobacco: Never Used  . Alcohol use No     Allergies   Review of patient's allergies indicates no known allergies.   Review of Systems Review of Systems  Constitutional: Positive for fatigue. Negative for chills and fever.  HENT: Negative for congestion, rhinorrhea and sore throat.   Eyes: Negative for visual disturbance.  Respiratory: Negative for cough, shortness of breath and wheezing.   Cardiovascular: Negative for chest pain and leg swelling.  Gastrointestinal: Positive for abdominal pain and nausea. Negative for diarrhea and vomiting.  Genitourinary: Positive for flank pain and frequency. Negative for dysuria, pelvic pain, vaginal bleeding and vaginal discharge.  Musculoskeletal: Negative for neck pain.  Skin: Negative for rash.  Allergic/Immunologic: Negative for immunocompromised state.  Neurological: Negative for syncope and headaches.  Hematological: Does not  bruise/bleed easily.  All other systems reviewed and are negative.    Physical Exam Updated Vital Signs BP 98/65 (BP Location: Right Arm)   Pulse 88   Temp 97.7 F (36.5 C) (Oral)   Resp 18   Ht 5\' 2"  (1.575 m)   Wt 175 lb (79.4 kg)   LMP 10/13/2015   SpO2 97%   BMI 32.01 kg/m   Physical Exam  Constitutional: She is oriented to person, place, and time. She appears well-developed and well-nourished. No distress.  HENT:  Head: Normocephalic and atraumatic.  Eyes: Conjunctivae are normal.    Neck: Neck supple.  Cardiovascular: Normal rate, regular rhythm and normal heart sounds.  Exam reveals no friction rub.   No murmur heard. Pulmonary/Chest: Effort normal and breath sounds normal. No respiratory distress. She has no wheezes. She has no rales.  Abdominal: Soft. Bowel sounds are normal. She exhibits no distension. There is tenderness (Diffuse tenderness to palpation, worse than right lower quadrant, left lower quadrant, and left flank. No overt CVA tenderness.).  Musculoskeletal: She exhibits no edema.  Neurological: She is alert and oriented to person, place, and time. She exhibits normal muscle tone.  Skin: Skin is warm. Capillary refill takes less than 2 seconds.  Psychiatric: She has a normal mood and affect.  Nursing note and vitals reviewed.    ED Treatments / Results  Labs (all labs ordered are listed, but only abnormal results are displayed) Labs Reviewed  COMPREHENSIVE METABOLIC PANEL - Abnormal; Notable for the following:       Result Value   Glucose, Bld 100 (*)    ALT 12 (*)    All other components within normal limits  URINALYSIS, ROUTINE W REFLEX MICROSCOPIC (NOT AT [facility])  PREGNANCY, URINE  CBC WITH DIFFERENTIAL/PLATELET  LIPASE, BLOOD    EKG  EKG Interpretation None       Radiology US Transvaginal Non-ob  Result Date: 10/27/2015 CLINICAL DATA:  Bilateral lower quadrant pain and tenderness for 4 weeks. History of prior cesarean section and right salpingectomy. EXAM: TRANSABDOMINAL AND TRANSVAGINAL ULTRASOUND OF PELVIS DOPPLER ULTRASOUND OF OVARIES TECHNIQUE: Both transabdominal and transvaginal ultrasound examinations of the pelvis were performed. Transabdominal technique was performed for global imaging of the pelvis including uterus, ovaries, adnexal regions, and pelvic cul-de-sac. It was necessary to proceed with endovaginal exam following the transabdominal exam to visualize the ovaries. Color and duplex Doppler ultrasound was utilized to  evaluate blood flow to the ovaries. COMPARISON:  CT abdomen and pelvis this same day FINDINGS: Uterus Measurements: 9.7 x 3.5 x 5.4 cm. No fibroids or other mass visualized. Endometrium Thickness: 10 mm.  No focal abnormality visualized. Right ovary Removed. Left ovary Measurements: 3.4 x 1.8 x 2.7 cm. Normal appearance/no adnexal mass. Pulsed Doppler evaluation of the left demonstrates normal low-resistance arterial and venous waveforms. Other findings No abnormal free fluid. IMPRESSION: No acute abnormality. No finding to explain the patient's symptoms. Status post removal of the right ovary. Electronically Signed   By: Inge Rise M.D.   On: 10/27/2015 15:16   US Pelvis Complete  Result Date: 10/27/2015 CLINICAL DATA:  Bilateral lower quadrant pain and tenderness for 4 weeks. History of prior cesarean section and right salpingectomy. EXAM: TRANSABDOMINAL AND TRANSVAGINAL ULTRASOUND OF PELVIS DOPPLER ULTRASOUND OF OVARIES TECHNIQUE: Both transabdominal and transvaginal ultrasound examinations of the pelvis were performed. Transabdominal technique was performed for global imaging of the pelvis including uterus, ovaries, adnexal regions, and pelvic cul-de-sac. It was necessary to proceed with endovaginal exam  following the transabdominal exam to visualize the ovaries. Color and duplex Doppler ultrasound was utilized to evaluate blood flow to the ovaries. COMPARISON:  CT abdomen and pelvis this same day FINDINGS: Uterus Measurements: 9.7 x 3.5 x 5.4 cm. No fibroids or other mass visualized. Endometrium Thickness: 10 mm.  No focal abnormality visualized. Right ovary Removed. Left ovary Measurements: 3.4 x 1.8 x 2.7 cm. Normal appearance/no adnexal mass. Pulsed Doppler evaluation of the left demonstrates normal low-resistance arterial and venous waveforms. Other findings No abnormal free fluid. IMPRESSION: No acute abnormality. No finding to explain the patient's symptoms. Status post removal of the right  ovary. Electronically Signed   By: Inge Rise M.D.   On: 10/27/2015 15:16   Ct Abdomen Pelvis W Contrast  Result Date: 10/27/2015 CLINICAL DATA:  35 year old female with bilateral lower quadrant pain and tenderness for 4 weeks. Prior C-section and cholecystectomy. Initial encounter. EXAM: CT ABDOMEN AND PELVIS WITH CONTRAST TECHNIQUE: Multidetector CT imaging of the abdomen and pelvis was performed using the standard protocol following bolus administration of intravenous contrast. CONTRAST:  140mL ISOVUE-300 IOPAMIDOL (ISOVUE-300) INJECTION 61% COMPARISON:  10/19/2013 CT. FINDINGS: Lower chest: Lung bases clear.  Heart size top-normal. Hepatobiliary: Elongated liver spanning over 22.8 cm. Mild fatty infiltration without focal worrisome hepatic lesion. Post cholecystectomy. Pancreas: No mass or inflammation. Spleen: No enlargement or mass. Adrenals/Urinary Tract: No adrenal or renal mass. No renal or ureteral obstructing stone. Stomach/Bowel: No extra luminal bowel inflammatory process, free fluid or free air. Vascular/Lymphatic: No abdominal aortic aneurysm.  No adenopathy. Reproductive: Left ovary measures up to 3.4 cm. Other: Mild diastases rectus muscles with small fat containing periumbilical hernia. Anterior bladder wall process may represent urachal remnant and/or scarring, without change. Musculoskeletal: No worrisome bony abnormality or high-grade spinal stenosis. IMPRESSION: No extra luminal bowel inflammatory process, free fluid or free air. Elongated liver spanning over 22.8 cm.  Mild fatty infiltration. Anterior bladder wall process may represent urachal remnant and/or scarring, without change. Electronically Signed   By: Genia Del M.D.   On: 10/27/2015 13:26   [place] Art/ven Flow Abd Pelv Doppler  Result Date: 10/27/2015 CLINICAL DATA:  Bilateral lower quadrant pain and tenderness for 4 weeks. History of prior cesarean section and right salpingectomy. EXAM: TRANSABDOMINAL AND  TRANSVAGINAL ULTRASOUND OF PELVIS DOPPLER ULTRASOUND OF OVARIES TECHNIQUE: Both transabdominal and transvaginal ultrasound examinations of the pelvis were performed. Transabdominal technique was performed for global imaging of the pelvis including uterus, ovaries, adnexal regions, and pelvic cul-de-sac. It was necessary to proceed with endovaginal exam following the transabdominal exam to visualize the ovaries. Color and duplex Doppler ultrasound was utilized to evaluate blood flow to the ovaries. COMPARISON:  CT abdomen and pelvis this same day FINDINGS: Uterus Measurements: 9.7 x 3.5 x 5.4 cm. No fibroids or other mass visualized. Endometrium Thickness: 10 mm.  No focal abnormality visualized. Right ovary Removed. Left ovary Measurements: 3.4 x 1.8 x 2.7 cm. Normal appearance/no adnexal mass. Pulsed Doppler evaluation of the left demonstrates normal low-resistance arterial and venous waveforms. Other findings No abnormal free fluid. IMPRESSION: No acute abnormality. No finding to explain the patient's symptoms. Status post removal of the right ovary. Electronically Signed   By: Inge Rise M.D.   On: 10/27/2015 15:16    Procedures Procedures (including critical care time)  Medications Ordered in ED Medications  morphine 4 MG/ML injection 4 mg (4 mg Intravenous Given 10/27/15 1112)  ketorolac (TORADOL) 15 MG/ML injection 15 mg (15 mg Intravenous Given 10/27/15 1110)  morphine 4 MG/ML injection 4 mg (4 mg Intravenous Given 10/27/15 1202)  iopamidol (ISOVUE-300) 61 % injection 100 mL (100 mLs Intravenous Contrast Given 10/27/15 1242)  gi cocktail (Maalox,Lidocaine,Donnatal) (30 mLs Oral Given 10/27/15 1416)  metoCLOPramide (REGLAN) injection 10 mg (10 mg Intravenous Given 10/27/15 1415)  diphenhydrAMINE (BENADRYL) injection 25 mg (25 mg Intravenous Given 10/27/15 1415)     Initial Impression / Assessment and Plan / ED Course  I have reviewed the triage vital signs and the nursing  notes.  Pertinent labs & imaging results that were available during my care of the patient were reviewed by me and considered in my medical decision making (see chart for details).  Clinical Course    35 year old female with past medical history of ovarian cyst who presents with diffuse abdominal pain, worse over the last several days but present for months. See history of present illness above. On arrival, vital signs are stable and within normal limits. She does have diffuse tenderness on exam. Initial differential is very broad as the patient has multiple complaints and areas of tenderness. Differential includes colitis, diverticulitis, UTI, pyelonephritis, kidney stone, ovarian cyst, less likely PID or other uterine pathology given absence of any uterine symptoms such as vaginal bleeding or discharge. Denies any dyspareunia. Given undifferentiated nature of pain, will obtain CT scan and lab work. Patient is in agreement with this plan.  CT scan shows no acute laterality. Lab work is unremarkable with normal white count, normal renal function, and normal LFTs. Patient continues to endorse severe pain. While the location of pain seems to be changing, consistent with likely IBS versus functional abdominal pain, cannot rule out ovarian pathology as well given difficult historian. Will obtain ultrasound.  Ultrasound shows no evidence of torsion or cystic mass. Patient's pain is improving. Will treat with Bentyl, naproxen, and outpatient follow-up. Strict return precautions were given.  Final Clinical Impressions(s) / ED Diagnoses   Final diagnoses:  Adnexal pain  Undifferentiated abdominal pain    New Prescriptions Discharge Medication List as of 10/27/2015  3:35 PM    START taking these medications   Details  dicyclomine (BENTYL) 20 MG tablet Take 1 tablet (20 mg total) by mouth 4 (four) times daily -  before meals and at bedtime., Starting Tue 10/27/2015, Until Fri 11/06/2015, Print     naproxen (NAPROSYN) 375 MG tablet Take 1 tablet (375 mg total) by mouth 2 (two) times daily as needed for moderate pain., Starting Tue 10/27/2015, Until Tue 11/03/2015, Print         Duffy Bruce, MD 10/27/15 [phone]

## 2015-10-27 NOTE — ED Triage Notes (Signed)
C/o right lower back pain x 3 weeks.  No injury. C/o burning with urination.

## 2015-10-27 NOTE — ED Notes (Signed)
Pt states drinking fluid for CT is making her abd hurt worse. Will make MD aware.

## 2016-01-09 ENCOUNTER — Encounter (HOSPITAL_BASED_OUTPATIENT_CLINIC_OR_DEPARTMENT_OTHER): Payer: Self-pay | Admitting: Emergency Medicine

## 2016-01-09 ENCOUNTER — Emergency Department (HOSPITAL_BASED_OUTPATIENT_CLINIC_OR_DEPARTMENT_OTHER)
Admission: EM | Admit: 2016-01-09 | Discharge: 2016-01-09 | Disposition: A | Discharge status: Home / Self Care | Payer: Medicaid Other | Attending: Emergency Medicine | Admitting: Emergency Medicine

## 2016-01-09 DIAGNOSIS — N3000 Acute cystitis without hematuria: Secondary | ICD-10-CM

## 2016-01-09 LAB — URINALYSIS, MICROSCOPIC (REFLEX): RBC / HPF: NONE SEEN RBC/hpf (ref 0–5)

## 2016-01-09 LAB — URINALYSIS, ROUTINE W REFLEX MICROSCOPIC
BILIRUBIN URINE: NEGATIVE
Glucose, UA: NEGATIVE mg/dL
Hgb urine dipstick: NEGATIVE
Ketones, ur: NEGATIVE mg/dL
NITRITE: NEGATIVE
Protein, ur: NEGATIVE mg/dL
SPECIFIC GRAVITY, URINE: 1.01 (ref 1.005–1.030)
pH: 5.5 (ref 5.0–8.0)

## 2016-01-09 LAB — PREGNANCY, URINE: Preg Test, Ur: NEGATIVE

## 2016-01-09 MED ORDER — CEPHALEXIN 500 MG PO CAPS: 500.0000 mg | ORAL_CAPSULE | Freq: Three times a day (TID) | ORAL | 0 refills | Status: AC

## 2016-01-09 MED ORDER — CEPHALEXIN 250 MG PO CAPS
500.0000 mg | ORAL_CAPSULE | Freq: Once | ORAL | Status: AC
  Administered 2016-01-09: 500 mg via ORAL
  Filled 2016-01-09: qty 2

## 2016-01-09 NOTE — ED Triage Notes (Signed)
Patient was in [place] last week and started to have a burning to the center of her stomach. She now has pain with urination and urinary frequency

## 2016-01-09 NOTE — ED Provider Notes (Signed)
[facility] DEPT [facility] Provider Note   CSN: [id number] Arrival date & time: 01/09/16  1533  By signing my name below, I, Jeanell Sparrow, attest that this documentation has been prepared under the direction and in the presence of Leo Grosser, MD . Electronically Signed: Jeanell Sparrow, Scribe. 01/09/2016. 6:16 PM.  History   Chief Complaint Chief Complaint  Patient presents with  . Urinary Frequency   The history is provided by the patient and a parent. No language interpreter was used.   HPI Comments: Krista Rowland is a 35 y.o. female who presents to the Emergency Department complaining of urinary frequency that started more than a week ago. She states she traveled to [place] last week. She reports associated symptoms of burning sensation during urination and leg pain. She state no modifying factors. She admits to hx of oophorectomy (right side) and cholecystectomy. She denies any other complaints.   Past Medical History:  Diagnosis Date  . Gestational diabetes   . No pertinent past medical history   . Ovarian cyst     Patient Active Problem List   Diagnosis Date Noted  . UTI (lower urinary tract infection) 03/18/2014  . Supervision of low-risk pregnancy 04/16/2013    Past Surgical History:  Procedure Laterality Date  . CESAREAN SECTION    . CHOLECYSTECTOMY    . LAPAROSCOPY  01/31/2011   Procedure: LAPAROSCOPY OPERATIVE;  Surgeon: Jonnie Kind, MD;  Location: [facility];  Service: Gynecology;  Laterality: Right;  laparoscopic right lateral salpingostomy    OB History    Gravida Para Term Preterm AB Living   6 1 1   3 1    SAB TAB Ectopic Multiple Live Births       3   1       Home Medications    Prior to Admission medications   Medication Sig Start Date End Date Taking? Authorizing Provider  cephALEXin (KEFLEX) 500 MG capsule Take 1 capsule (500 mg total) by mouth 3 (three) times daily. 01/10/16 01/20/16  Leo Grosser, MD  dicyclomine (BENTYL) 20 MG tablet Take 1  tablet (20 mg total) by mouth 4 (four) times daily -  before meals and at bedtime. 10/27/15 11/06/15  Duffy Bruce, MD    Family History Family History  Problem Relation Age of Onset  . Anesthesia problems Neg Hx   . Hypotension Neg Hx   . Malignant hyperthermia Neg Hx   . Pseudochol deficiency Neg Hx     Social History Social History  Substance Use Topics  . Smoking status: Never Smoker  . Smokeless tobacco: Never Used  . Alcohol use No     Allergies   Patient has no known allergies.   Review of Systems Review of Systems  Genitourinary: Positive for frequency.  All other systems reviewed and are negative.    Physical Exam Updated Vital Signs BP 106/78 (BP Location: Left Arm)   Pulse 99   Temp 98.3 F (36.8 C) (Oral)   Resp 18   Wt 175 lb (79.4 kg)   LMP 12/21/2015   SpO2 100%   BMI 32.01 kg/m   Physical Exam  Constitutional: She is oriented to person, place, and time. She appears well-developed and well-nourished. No distress.  HENT:  Head: Normocephalic.  Nose: Nose normal.  Eyes: Conjunctivae are normal.  Neck: Neck supple. No tracheal deviation present.  Cardiovascular: Normal rate, regular rhythm, S1 normal, S2 normal and normal heart sounds.   Pulmonary/Chest: Effort normal. No respiratory distress. She has no  wheezes. She has no rales.  Abdominal: Soft. She exhibits no distension.  Musculoskeletal: She exhibits edema.  +1 non-pitting edema of the bilateral ankles.   Neurological: She is alert and oriented to person, place, and time.  Skin: Skin is warm and dry.  Psychiatric: She has a normal mood and affect.  Vitals reviewed.    ED Treatments / Results  DIAGNOSTIC STUDIES: Oxygen Saturation is 100% on RA, normal by my interpretation.    COORDINATION OF CARE: 6:20 PM- Pt advised of plan for treatment and pt agrees.  Labs (all labs ordered are listed, but only abnormal results are displayed) Labs Reviewed  URINALYSIS, ROUTINE W REFLEX  MICROSCOPIC - Abnormal; Notable for the following:       Result Value   APPearance CLOUDY (*)    Leukocytes, UA MODERATE (*)    All other components within normal limits  URINALYSIS, MICROSCOPIC (REFLEX) - Abnormal; Notable for the following:    Bacteria, UA RARE (*)    Squamous Epithelial / LPF 0-5 (*)    All other components within normal limits  URINE CULTURE  PREGNANCY, URINE    EKG  EKG Interpretation None       Radiology No results found.  Procedures Procedures (including critical care time)  Medications Ordered in ED Medications  cephALEXin (KEFLEX) capsule 500 mg (500 mg Oral Given 01/09/16 1836)     Initial Impression / Assessment and Plan / ED Course  I have reviewed the triage vital signs and the nursing notes.  Pertinent labs & imaging results that were available during my care of the patient were reviewed by me and considered in my medical decision making (see chart for details).  Clinical Course     35 y.o. female presents with typical UTI symptoms, well appearing. Separate complaint of mild bilateral leg edema after travel, no signs of DVT. Recommended compression stockings. Will treat empirically with keflex pending culture.   Final Clinical Impressions(s) / ED Diagnoses   Final diagnoses:  Acute cystitis without hematuria    New Prescriptions New Prescriptions   CEPHALEXIN (KEFLEX) 500 MG CAPSULE    Take 1 capsule (500 mg total) by mouth 3 (three) times daily.   I personally performed the services described in this documentation, which was scribed in my presence. The recorded information has been reviewed and is accurate.      Leo Grosser, MD 01/10/16 [phone]

## 2016-01-12 LAB — URINE CULTURE: Culture: >=100000 — AB

## 2016-01-13 ENCOUNTER — Telehealth (HOSPITAL_BASED_OUTPATIENT_CLINIC_OR_DEPARTMENT_OTHER): Payer: Self-pay | Admitting: Emergency Medicine

## 2016-01-13 NOTE — Telephone Encounter (Signed)
Post ED Visit - Positive Culture Follow-up  Culture report reviewed by antimicrobial stewardship pharmacist:  []  Elenor Quinones, Pharm.D. [x]  Heide Guile, Pharm.D., BCPS []  Parks Neptune, Pharm.D. []  Alycia Rossetti, Pharm.D., BCPS []  [place], Pharm.D., BCPS, AAHIVP []  Legrand Como, Pharm.D., BCPS, AAHIVP []  Milus Glazier, Pharm.D. []  Rob Evette Doffing, Pharm.D.  Positive urine culture Treated with cephalexin, organism sensitive to the same and no further patient follow-up is required at this time.  [name] 01/13/2016, 12:23 PM

## 2020-04-10 ENCOUNTER — Emergency Department (HOSPITAL_COMMUNITY)
Admission: AD | Admit: 2020-04-10 | Discharge: 2020-04-10 | Disposition: A | Discharge status: Home / Self Care | Payer: BLUE CROSS/BLUE SHIELD | Attending: Obstetrics and Gynecology | Admitting: Obstetrics and Gynecology

## 2020-04-10 ENCOUNTER — Emergency Department (HOSPITAL_COMMUNITY): Payer: BLUE CROSS/BLUE SHIELD

## 2020-04-10 ENCOUNTER — Encounter (HOSPITAL_COMMUNITY): Payer: Self-pay | Admitting: Obstetrics and Gynecology

## 2020-04-10 ENCOUNTER — Other Ambulatory Visit: Payer: Self-pay

## 2020-04-10 DIAGNOSIS — N8500 Endometrial hyperplasia, unspecified: Secondary | ICD-10-CM | POA: Diagnosis not present

## 2020-04-10 DIAGNOSIS — N83202 Unspecified ovarian cyst, left side: Secondary | ICD-10-CM | POA: Insufficient documentation

## 2020-04-10 DIAGNOSIS — R102 Pelvic and perineal pain: Secondary | ICD-10-CM | POA: Insufficient documentation

## 2020-04-10 DIAGNOSIS — D259 Leiomyoma of uterus, unspecified: Secondary | ICD-10-CM

## 2020-04-10 DIAGNOSIS — Z3202 Encounter for pregnancy test, result negative: Secondary | ICD-10-CM | POA: Diagnosis not present

## 2020-04-10 DIAGNOSIS — Z79899 Other long term (current) drug therapy: Secondary | ICD-10-CM | POA: Diagnosis not present

## 2020-04-10 DIAGNOSIS — R1032 Left lower quadrant pain: Secondary | ICD-10-CM | POA: Diagnosis present

## 2020-04-10 LAB — COMPREHENSIVE METABOLIC PANEL
ALT: 10 U/L (ref 0–44)
AST: 14 U/L — ABNORMAL LOW (ref 15–41)
Albumin: 4.1 g/dL (ref 3.5–5.0)
Alkaline Phosphatase: 46 U/L (ref 38–126)
Anion gap: 3 — ABNORMAL LOW (ref 5–15)
BUN: 7 mg/dL (ref 6–20)
CO2: 25 mmol/L (ref 22–32)
Calcium: 9.4 mg/dL (ref 8.9–10.3)
Chloride: 109 mmol/L (ref 98–111)
Creatinine, Ser: 0.77 mg/dL (ref 0.44–1.00)
GFR, Estimated: >60 mL/min (ref >60)
Glucose, Bld: 101 mg/dL — ABNORMAL HIGH (ref 70–99)
Potassium: 3.9 mmol/L (ref 3.5–5.1)
Sodium: 137 mmol/L (ref 135–145)
Total Bilirubin: 0.7 mg/dL (ref 0.3–1.2)
Total Protein: 7.7 g/dL (ref 6.5–8.1)

## 2020-04-10 LAB — CBC
HCT: 41.7 % (ref 36.0–46.0)
Hemoglobin: 13.4 g/dL (ref 12.0–15.0)
MCH: 26.6 pg (ref 26.0–34.0)
MCHC: 32.1 g/dL (ref 30.0–36.0)
MCV: 82.9 fL (ref 80.0–100.0)
Platelets: 207 10*3/uL (ref 150–400)
RBC: 5.03 MIL/uL (ref 3.87–5.11)
RDW: 12.6 % (ref 11.5–15.5)
WBC: 6.7 10*3/uL (ref 4.0–10.5)
nRBC: 0 % (ref 0.0–0.2)

## 2020-04-10 LAB — URINALYSIS, ROUTINE W REFLEX MICROSCOPIC
Bilirubin Urine: NEGATIVE
Glucose, UA: NEGATIVE mg/dL
Hgb urine dipstick: NEGATIVE
Ketones, ur: NEGATIVE mg/dL
Leukocytes,Ua: NEGATIVE
Nitrite: NEGATIVE
Protein, ur: NEGATIVE mg/dL
Specific Gravity, Urine: 1.012 (ref 1.005–1.030)
pH: 5 (ref 5.0–8.0)

## 2020-04-10 LAB — WET PREP, GENITAL
Clue Cells Wet Prep HPF POC: NONE SEEN
Sperm: NONE SEEN
Trich, Wet Prep: NONE SEEN
Yeast Wet Prep HPF POC: NONE SEEN

## 2020-04-10 LAB — POCT PREGNANCY, URINE: Preg Test, Ur: NEGATIVE

## 2020-04-10 LAB — I-STAT BETA HCG BLOOD, ED (MC, WL, AP ONLY): I-stat hCG, quantitative: <5 m[IU]/mL (ref <5)

## 2020-04-10 LAB — LIPASE, BLOOD: Lipase: 37 U/L (ref 11–51)

## 2020-04-10 MED ORDER — KETOROLAC TROMETHAMINE 30 MG/ML IJ SOLN
30.0000 mg | Freq: Once | INTRAMUSCULAR | Status: AC
  Administered 2020-04-10: 30 mg via INTRAVENOUS
  Filled 2020-04-10: qty 1

## 2020-04-10 NOTE — MAU Note (Signed)
Krista Rowland is a 40 y.o.  here in MAU reporting: left lower abdominal pain for the past 2 weeks. Is concerned about her ovary. Pt reports negative UPT at home.   LMP: 02/02/20  Onset of complaint: ongoing for the past 2 weeks  Pain score: 3/10  Vitals:   04/10/20 1157  BP: 113/77  Pulse: 69  Resp: 16  Temp: 98 F (36.7 C)  SpO2: 99%     Lab orders placed from triage: UPT

## 2020-04-10 NOTE — MAU Provider Note (Signed)
Event Date/Time   First Provider Initiated Contact with Patient 04/10/20 1203      S Ms. Krista Rowland is a 40 y.o. [id number] patient who presents to MAU today with complaint of LLQ pain x 2 weeks. She is concerned it is her ovary. She had a negative pregnancy test at home today.  O BP 113/77 (BP Location: Right Arm)   Pulse 69   Temp 98 F (36.7 C) (Oral)   Resp 16   Wt 77.2 kg   LMP 02/02/2020   SpO2 99% Comment: room air  BMI 31.15 kg/m  Physical Exam Vitals and nursing note reviewed.  Constitutional:      Appearance: She is well-developed.  Cardiovascular:     Rate and Rhythm: Normal rate.  Pulmonary:     Effort: Pulmonary effort is normal.  Abdominal:     Palpations: Abdomen is soft.  Musculoskeletal:        General: Normal range of motion.     Cervical back: Normal range of motion.  Skin:    General: Skin is warm and dry.  Neurological:     Mental Status: She is alert and oriented to person, place, and time.  Psychiatric:        Behavior: Behavior normal.        Thought Content: Thought content normal.        Judgment: Judgment normal.     A Medical screening exam complete  1. Negative pregnancy test   2. LLQ pain     P Discharge from MAU in stable condition Patient given the option of transfer to [facility] for further evaluation or seek care in outpatient facility of choice  Pt prefers to go to [facility], walked to ED by MAU staff  Elvera Maria, CNM 04/10/2020 12:03 PM

## 2020-04-10 NOTE — Discharge Instructions (Addendum)
°  The results of your [place] are as follows: "Surgical absence of RIGHT ovary. 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 Simple cyst LEFT ovary 3.2 cm diameter; no follow-up imaging recommended, as above. Endometrial complex 22 mm thick without focal abnormality; endometrial thickness is considered abnormal. Consider follow-up by [place] in 6-8 weeks, during the week immediately following menses"  You will need to follow up with your OB-GYN in regards to the findings on your ultrasound.   Please make an appointment in the next 1-2 weeks for follow up.   In the meantime you may rotate tylenol and motrin for pain.   Please return to the emergency department for any new or worsening symptoms.

## 2020-04-10 NOTE — ED Triage Notes (Signed)
Patient complains of left lower abdominal pain for the last two to three weeks. Last period three months ago. Patient alert, oriented, and in no apparent distress.

## 2020-04-10 NOTE — ED Provider Notes (Signed)
[facility] Provider Note   CSN: [id number] Arrival date & time: 04/10/20  1119     History Chief Complaint  Patient presents with  . Abdominal Pain   Urdu interpretor was used throughout this evaluation  Krista Rowland is a 40 y.o. female.  HPI   Pt is a 40 y/o female with a h/o gestational diabetes, multiple ectopic pregnancies, ovarian cyst s/p right oophorectomy who presents to the ED today for eval of LLQ abd pain that has been ongoing for several weeks. The pain is intermittent. Pain is describes as sharp and she rates the pain is 3-4/10 currently. Denies any associated fevers, nausea, vomiting, diarrhea, constipation, dysuria, frequency, urgency or hematuria. Denies vaginal discharge or vaginal bleeding. Denies hematochezia or melena. She has had mild relief with tylenol.   LMP 02/02/2020.   Past Medical History:  Diagnosis Date  . Gestational diabetes   . No pertinent past medical history   . Ovarian cyst     Patient Active Problem List   Diagnosis Date Noted  . UTI (lower urinary tract infection) 03/18/2014  . Supervision of low-risk pregnancy 04/16/2013    Past Surgical History:  Procedure Laterality Date  . CESAREAN SECTION    . CHOLECYSTECTOMY    . LAPAROSCOPY  01/31/2011   Procedure: LAPAROSCOPY OPERATIVE;  Surgeon: Jonnie Kind, MD;  Location: [facility];  Service: Gynecology;  Laterality: Right;  laparoscopic right lateral salpingostomy     OB History    Gravida  6   Para  1   Term  1   Preterm      AB  3   Living  1     SAB      IAB      Ectopic  3   Multiple      Live Births  1           Family History  Problem Relation Age of Onset  . Anesthesia problems Neg Hx   . Hypotension Neg Hx   . Malignant hyperthermia Neg Hx   . Pseudochol deficiency Neg Hx     Social History   Tobacco Use  . Smoking status: Never Smoker  . Smokeless tobacco: Never Used  Substance Use Topics  . Alcohol  use: No  . Drug use: No    Home Medications Prior to Admission medications   Medication Sig Start Date End Date Taking? Authorizing Provider  dicyclomine (BENTYL) 20 MG tablet Take 1 tablet (20 mg total) by mouth 4 (four) times daily -  before meals and at bedtime. 10/27/15 11/06/15  Duffy Bruce, MD    Allergies    Patient has no known allergies.  Review of Systems   Review of Systems  Constitutional: Negative for chills and fever.  HENT: Negative for ear pain and sore throat.   Eyes: Negative for pain and visual disturbance.  Respiratory: Negative for cough and shortness of breath.   Cardiovascular: Negative for chest pain.  Gastrointestinal: Positive for abdominal pain. Negative for constipation, diarrhea, nausea and vomiting.  Genitourinary: Positive for pelvic pain. Negative for dysuria, frequency, hematuria, urgency, vaginal bleeding and vaginal discharge.  Musculoskeletal: Negative for back pain.  Skin: Negative for color change and rash.  Neurological: Negative for seizures and syncope.  All other systems reviewed and are negative.   Physical Exam Updated Vital Signs BP 116/77   Pulse 72   Temp 98.6 F (37 C)   Resp 18   Wt 77.2 kg  LMP 02/02/2020   SpO2 99%   BMI 31.15 kg/m   Physical Exam Vitals and nursing note reviewed.  Constitutional:      General: She is not in acute distress.    Appearance: She is well-developed.  HENT:     Head: Normocephalic and atraumatic.  Eyes:     Conjunctiva/sclera: Conjunctivae normal.  Cardiovascular:     Rate and Rhythm: Normal rate and regular rhythm.     Heart sounds: No murmur heard.   Pulmonary:     Effort: Pulmonary effort is normal. No respiratory distress.     Breath sounds: Normal breath sounds.  Abdominal:     General: Bowel sounds are normal.     Palpations: Abdomen is soft.     Tenderness: There is no abdominal tenderness.  Genitourinary:    Comments: Exam performed by Rodney Booze,  exam  chaperoned Date: 04/10/2020 Pelvic exam: normal external genitalia without evidence of trauma. VULVA: normal appearing vulva with no masses, tenderness or lesion. VAGINA: normal appearing vagina with normal color and discharge, no lesions. CERVIX: normal appearing cervix without lesions, cervical motion tenderness absent, cervical os closed with out purulent discharge; vaginal discharge - none present, Wet prep and DNA probe for chlamydia and GC obtained.   ADNEXA: normal adnexa in size on left with left adnexal ttp UTERUS: uterus is normal size, shape, consistency and nontender.   Musculoskeletal:     Cervical back: Neck supple.  Skin:    General: Skin is warm and dry.  Neurological:     Mental Status: She is alert.     ED Results / Procedures / Treatments   Labs (all labs ordered are listed, but only abnormal results are displayed) Labs Reviewed  WET PREP, GENITAL - Abnormal; Notable for the following components:      Result Value   WBC, Wet Prep HPF POC MANY (*)    All other components within normal limits  COMPREHENSIVE METABOLIC PANEL - Abnormal; Notable for the following components:   Glucose, Bld 101 (*)    AST 14 (*)    Anion gap 3 (*)    All other components within normal limits  LIPASE, BLOOD  CBC  URINALYSIS, ROUTINE W REFLEX MICROSCOPIC  POCT PREGNANCY, URINE  I-STAT BETA HCG BLOOD, ED ([facility], [facility], [facility] ONLY)  GC/CHLAMYDIA PROBE AMP ([place]) NOT AT [facility]    EKG None  Radiology US PELVIC COMPLETE W TRANSVAGINAL AND TORSION R/O  Result Date: 04/10/2020 CLINICAL DATA:  LEFT adnexal pain for 4 weeks, prior RIGHT oophorectomy. Unknown LMP EXAM: TRANSABDOMINAL AND TRANSVAGINAL ULTRASOUND OF PELVIS DOPPLER ULTRASOUND OF OVARIES TECHNIQUE: Both transabdominal and transvaginal ultrasound examinations of the pelvis were performed. Transabdominal technique was performed for global imaging of the pelvis including uterus, ovaries, adnexal regions, and pelvic cul-de-sac. It  was necessary to proceed with endovaginal exam following the transabdominal exam to visualize the lower uterine segment and adnexa. Color and duplex Doppler ultrasound was utilized to evaluate blood flow to the ovaries. COMPARISON:  10/27/2015 FINDINGS: Uterus Measurements: 8.0 x 4.5 x 5.7 cm = volume: 107 mL. Anteverted. Slightly heterogeneous myometrium. Small 4 mm subendometrial echogenic focus question calcification at posterior mid uterus. Tiny intramural leiomyoma at posterior uterus 10 x 5 x 8 mm. No additional masses. Endometrium Thickness: 22 mm. Appears thickened particularly at fundal portion. No focal mass or endometrial fluid Right ovary .  Surgically absent by history Left ovary Measurements: 4.7 x 2.8 x 3.3 cm = volume: RIGHT 2.5 mL.  Simple cyst 3.2 cm diameter; No followup imaging recommended. Note: This recommendation does not apply to premenarchal patients or to those with increased risk (genetic, family history, elevated tumor markers or other high-risk factors) of ovarian cancer. Reference: Radiology 2019 Nov; 293(2):359-371. Pulsed Doppler evaluation of LEFT ovary demonstrates normal low-resistance arterial and venous waveforms. RIGHT ovary is surgically absent. Other findings Trace free pelvic fluid in RIGHT adnexa. No additional adnexal masses. IMPRESSION: Surgical absence of RIGHT ovary. 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 Simple cyst LEFT ovary 3.2 cm diameter; no follow-up imaging recommended, as above. Endometrial complex 22 mm thick without focal abnormality; endometrial thickness is considered abnormal. Consider follow-up by [place] in 6-8 weeks, during the week immediately following menses (exam timing is critical). Electronically Signed   By: Lavonia Dana M.D.   On: 04/10/2020 16:39    Procedures Procedures   Medications Ordered in ED Medications  ketorolac (TORADOL) 30 MG/ML injection 30 mg (30 mg Intravenous Given 04/10/20 1338)    ED Course  I have  reviewed the triage vital signs and the nursing notes.  Pertinent labs & imaging results that were available during my care of the patient were reviewed by me and considered in my medical decision making (see chart for details).    MDM Rules/Calculators/A&P                          40 year old female presenting for evaluation of left pelvic pain ongoing for several weeks.  Reviewed/interpreted labs, prick testing UA ordered by [facility] which were all reassuring.  Pelvic US: Surgical absence of RIGHT ovary. 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 Simple cyst LEFT ovary 3.2 cm diameter; no follow-up imaging recommended, as above. Endometrial complex 22 mm thick without focal abnormality; endometrial thickness is considered abnormal. Consider follow-up by [place] in 6-8 weeks, during the week immediately following menses  MDM: Pt with pelvic pain. Likely etiology of pain is her ovarian cyst and uterine fibroids. Appears comfortable after medications and has no complaints prior to d/c. I discussed the results and need for f/u with both the patient and her husband. Husband states he will make an appointment to follow up with her obgyn in regards to the findings on her [place]. Have advised otc meds in the mean time for her pain. Advised on return precautions. She voices understanding of the plan and reasons to return. All questions answered pt stable for discharge.    Final Clinical Impression(s) / ED Diagnoses Final diagnoses:  Negative pregnancy test  Adnexal pain  Cyst of left ovary  Uterine leiomyoma, unspecified location  Endometrial hyperplasia, unspecified    Rx / DC Orders ED Discharge Orders    None       Bishop Dublin 04/10/20 1736    Daleen Bo, MD 04/11/20 [phone]

## 2020-04-13 LAB — GC/CHLAMYDIA PROBE AMP (~~LOC~~) NOT AT ARMC
Chlamydia: NEGATIVE
Comment: NEGATIVE
Comment: NORMAL
Neisseria Gonorrhea: NEGATIVE
